# Patient Record
Sex: Female | Born: 1945 | Race: White | Hispanic: No | Marital: Married | Smoking: Never smoker
Health system: Southern US, Community
[De-identification: ages and names within clinical notes are randomized; demographics above are authoritative.]

## PROBLEM LIST (undated history)

## (undated) DIAGNOSIS — R112 Nausea with vomiting, unspecified: Secondary | ICD-10-CM

## (undated) DIAGNOSIS — E7800 Pure hypercholesterolemia, unspecified: Secondary | ICD-10-CM

## (undated) DIAGNOSIS — M1990 Unspecified osteoarthritis, unspecified site: Secondary | ICD-10-CM

## (undated) DIAGNOSIS — Z9889 Other specified postprocedural states: Secondary | ICD-10-CM

## (undated) DIAGNOSIS — F419 Anxiety disorder, unspecified: Secondary | ICD-10-CM

## (undated) DIAGNOSIS — C801 Malignant (primary) neoplasm, unspecified: Secondary | ICD-10-CM

## (undated) HISTORY — PX: TUBAL LIGATION: SHX77

## (undated) HISTORY — PX: CHOLECYSTECTOMY: SHX55

## (undated) HISTORY — PX: KNEE SURGERY: SHX244

## (undated) HISTORY — PX: REFRACTIVE SURGERY: SHX103

## (undated) HISTORY — PX: TONSILLECTOMY: SUR1361

## (undated) HISTORY — PX: APPENDECTOMY: SHX54

## (undated) HISTORY — PX: CATARACT EXTRACTION W/ INTRAOCULAR LENS  IMPLANT, BILATERAL: SHX1307

## (undated) HISTORY — PX: BACK SURGERY: SHX140

## (undated) HISTORY — PX: SHOULDER ARTHROSCOPY: SHX128

## (undated) HISTORY — PX: CARPAL TUNNEL RELEASE: SHX101

## (undated) HISTORY — PX: ABDOMINAL HYSTERECTOMY: SHX81

---

## 2020-05-16 ENCOUNTER — Other Ambulatory Visit: Payer: Self-pay | Admitting: Neurological Surgery

## 2020-05-23 NOTE — Progress Notes (Signed)
[id number] [address], [place] [address] Phone: [phone] Fax: [phone]      Your procedure is scheduled on September 21  Report to [facility] Main Entrance "A" at 0530 A.M., and check in at the Admitting office.  Call this number if you have problems the morning of surgery:  [phone]  Call [phone] if you have any questions prior to your surgery date Monday-Friday 8am-4pm    Remember:  Do not eat or drink after midnight the night before your surgery     Take these medicines the morning of surgery with A SIP OF WATER  acetaminophen (TYLENOL) if needed escitalopram (LEXAPRO) gabapentin (NEURONTIN)  As of today, STOP taking any Aspirin (unless otherwise instructed by your surgeon) Aleve, Naproxen, Ibuprofen, Motrin, Advil, Goody's, BC's, all herbal medications, fish oil, and all vitamins.                      Do not wear jewelry, make up, or nail polish            Do not wear lotions, powders, perfumes/colognes, or deodorant.            Do not shave 48 hours prior to surgery.              Do not bring valuables to the hospital.            [facility] is not responsible for any belongings or valuables.  Do NOT Smoke (Tobacco/Vaping) or drink Alcohol 24 hours prior to your procedure If you use a CPAP at night, you may bring all equipment for your overnight stay.   Contacts, glasses, dentures or bridgework may not be worn into surgery.      For patients admitted to the hospital, discharge time will be determined by your treatment team.   Patients discharged the day of surgery will not be allowed to drive home, and someone needs to stay with them for 24 hours.    Special instructions:   [place]- Preparing For Surgery  Before surgery, you can play an important role. Because skin is not sterile, your skin needs to be as free of germs as possible. You can reduce the number of germs on your skin by washing with  CHG (chlorahexidine gluconate) Soap before surgery.  CHG is an antiseptic cleaner which kills germs and bonds with the skin to continue killing germs even after washing.    Oral Hygiene is also important to reduce your risk of infection.  Remember - BRUSH YOUR TEETH THE MORNING OF SURGERY WITH YOUR REGULAR TOOTHPASTE  Please do not use if you have an allergy to CHG or antibacterial soaps. If your skin becomes reddened/irritated stop using the CHG.  Do not shave (including legs and underarms) for at least 48 hours prior to first CHG shower. It is OK to shave your face.  Please follow these instructions carefully.   1. Shower the NIGHT BEFORE SURGERY and the MORNING OF SURGERY with CHG Soap.   2. If you chose to wash your hair, wash your hair first as usual with your normal shampoo.  3. After you shampoo, rinse your hair and body thoroughly to remove the shampoo.  4. Use CHG as you would any other liquid soap. You can apply CHG directly to the skin and wash gently with a scrungie or a clean washcloth.   5. Apply the CHG Soap to your body ONLY  FROM THE NECK DOWN.  Do not use on open wounds or open sores. Avoid contact with your eyes, ears, mouth and genitals (private parts). Wash Face and genitals (private parts)  with your normal soap.   6. Wash thoroughly, paying special attention to the area where your surgery will be performed.  7. Thoroughly rinse your body with warm water from the neck down.  8. DO NOT shower/wash with your normal soap after using and rinsing off the CHG Soap.  9. Pat yourself dry with a CLEAN TOWEL.  10. Wear CLEAN PAJAMAS to bed the night before surgery  11. Place CLEAN SHEETS on your bed the night of your first shower and DO NOT SLEEP WITH PETS.   Day of Surgery: Wear Clean/Comfortable clothing the morning of surgery Do not apply any deodorants/lotions.   Remember to brush your teeth WITH YOUR REGULAR TOOTHPASTE.   Please read over the following fact sheets  that you were given.

## 2020-05-24 ENCOUNTER — Encounter (HOSPITAL_COMMUNITY)
Admission: RE | Admit: 2020-05-24 | Discharge: 2020-05-24 | Disposition: A | Discharge status: Home / Self Care | Payer: Medicare PPO | Source: Ambulatory Visit | Attending: Neurological Surgery | Admitting: Neurological Surgery

## 2020-05-24 ENCOUNTER — Other Ambulatory Visit: Payer: Self-pay

## 2020-05-24 ENCOUNTER — Other Ambulatory Visit (HOSPITAL_COMMUNITY)
Admission: RE | Admit: 2020-05-24 | Discharge: 2020-05-24 | Disposition: A | Discharge status: Home / Self Care | Payer: Medicare PPO | Source: Ambulatory Visit | Attending: Neurological Surgery | Admitting: Neurological Surgery

## 2020-05-24 ENCOUNTER — Encounter (HOSPITAL_COMMUNITY): Payer: Self-pay

## 2020-05-24 DIAGNOSIS — M1990 Unspecified osteoarthritis, unspecified site: Secondary | ICD-10-CM | POA: Diagnosis not present

## 2020-05-24 DIAGNOSIS — E669 Obesity, unspecified: Secondary | ICD-10-CM | POA: Diagnosis not present

## 2020-05-24 DIAGNOSIS — Z79899 Other long term (current) drug therapy: Secondary | ICD-10-CM | POA: Insufficient documentation

## 2020-05-24 DIAGNOSIS — F419 Anxiety disorder, unspecified: Secondary | ICD-10-CM | POA: Insufficient documentation

## 2020-05-24 DIAGNOSIS — Z01812 Encounter for preprocedural laboratory examination: Secondary | ICD-10-CM | POA: Insufficient documentation

## 2020-05-24 DIAGNOSIS — E7800 Pure hypercholesterolemia, unspecified: Secondary | ICD-10-CM | POA: Diagnosis not present

## 2020-05-24 DIAGNOSIS — Z20822 Contact with and (suspected) exposure to covid-19: Secondary | ICD-10-CM | POA: Insufficient documentation

## 2020-05-24 DIAGNOSIS — Z6834 Body mass index (BMI) 34.0-34.9, adult: Secondary | ICD-10-CM | POA: Diagnosis not present

## 2020-05-24 DIAGNOSIS — M5412 Radiculopathy, cervical region: Secondary | ICD-10-CM | POA: Diagnosis not present

## 2020-05-24 HISTORY — DX: Anxiety disorder, unspecified: F41.9

## 2020-05-24 HISTORY — DX: Other specified postprocedural states: Z98.890

## 2020-05-24 HISTORY — DX: Pure hypercholesterolemia, unspecified: E78.00

## 2020-05-24 HISTORY — DX: Malignant (primary) neoplasm, unspecified: C80.1

## 2020-05-24 HISTORY — DX: Other specified postprocedural states: R11.2

## 2020-05-24 HISTORY — DX: Unspecified osteoarthritis, unspecified site: M19.90

## 2020-05-24 LAB — TYPE AND SCREEN
ABO/RH(D): O POS
Antibody Screen: NEGATIVE

## 2020-05-24 LAB — CBC
HCT: 45.3 % (ref 36.0–46.0)
Hemoglobin: 14.3 g/dL (ref 12.0–15.0)
MCH: 30.4 pg (ref 26.0–34.0)
MCHC: 31.6 g/dL (ref 30.0–36.0)
MCV: 96.4 fL (ref 80.0–100.0)
Platelets: 212 10*3/uL (ref 150–400)
RBC: 4.7 MIL/uL (ref 3.87–5.11)
RDW: 12 % (ref 11.5–15.5)
WBC: 5.4 10*3/uL (ref 4.0–10.5)
nRBC: 0 % (ref 0.0–0.2)

## 2020-05-24 LAB — SURGICAL PCR SCREEN
MRSA, PCR: NEGATIVE
Staphylococcus aureus: NEGATIVE

## 2020-05-24 LAB — SARS CORONAVIRUS 2 (TAT 6-24 HRS): SARS Coronavirus 2: NEGATIVE

## 2020-05-24 NOTE — Progress Notes (Signed)
PCP - Dr. Tawny Asal Cardiologist - patient denies  PPM/ICD - n/a Device Orders -  Rep Notified -   Chest x-ray - n/a EKG - n/a Stress Test - patient denies ECHO - patient stated it was 20+ years ago and she has never had to follow up regarding it. Cardiac Cath - patient denies  Sleep Study - patient denies CPAP -   Fasting Blood Sugar - n/a Checks Blood Sugar _____ times a day  Blood Thinner Instructions: n/a Aspirin Instructions:n/a  ERAS Protcol - n/a, order for NPO p midnight PRE-SURGERY Ensure or G2-   COVID TEST- after PAT appointment on 05/24/20   Anesthesia review: n/a  Patient denies shortness of breath, fever, cough and chest pain at PAT appointment   All instructions explained to the patient, with a verbal understanding of the material. Patient agrees to go over the instructions while at home for a better understanding. Patient also instructed to self quarantine after being tested for COVID-19. The opportunity to ask questions was provided.

## 2020-05-27 ENCOUNTER — Other Ambulatory Visit: Payer: Self-pay | Admitting: Neurological Surgery

## 2020-05-27 NOTE — Progress Notes (Signed)
Anesthesia Chart Review:  Case: [id number] Date/Time: 05/28/20 0715   Procedure: ANTERIOR CERVICAL DECOMPRESSION/DISCECTOMY FUSION, CERVICAL 4- CERVICAL 5, CERVICAL 5- CERVICAL 6 (N/A ) - 3C   Anesthesia type: General   Pre-op diagnosis: CERVICAL RADICULOPATHY   Location: [facility] OR ROOM 22 / [place] [facility]   Surgeons: Dawley, Theodoro Doing, DO      DISCUSSION: Patient is a 74 year old female scheduled for the above procedure.  History includes never smoker, postoperative N/V, anxiety, melanoma (back), hypercholesterolemia. BMI is consistent with obesity.   Surgical clearance signed by Dellia Nims, NP, "modeate risk". Last office note requested, but is pending.  Patient denied chest pain, shortness of breath, cough, fever PAT RN visit. She reportedly was recently diagnosed with hypercholesterolemia, but medication is not being started until following surgery.   05/24/20 presurgical COVID-19 test was negative. Anesthesia team to evaluate on the day of surgery.   VS: BP (!) 142/75   Pulse 73   Temp 36.5 C (Oral)   Resp 17   Ht 5\' 3"  (1.6 m)   Wt 87.4 kg   SpO2 97%   BMI 34.14 kg/m    PROVIDERS: Josem Kaufmann, MD is PCP ([facility] in [place])   LABS: Labs reviewed: Acceptable for surgery. (all labs ordered are listed, but only abnormal results are displayed)  Labs Reviewed  SURGICAL PCR SCREEN  CBC  TYPE AND SCREEN    EKG: N/A   CV: Patient reported a remote history of echocardiogram greater than 20 years ago that did not require ongoing follow-up.   Past Medical History:  Diagnosis Date  . Anxiety   . Arthritis    "in knees and neck"  . Cancer ([place])    "melanoma on back"  . High cholesterol   . PONV (postoperative nausea and vomiting)     Past Surgical History:  Procedure Laterality Date  . ABDOMINAL HYSTERECTOMY    . APPENDECTOMY    . BACK SURGERY     cervical, x2  . CARPAL TUNNEL RELEASE Right   . CATARACT EXTRACTION W/ INTRAOCULAR LENS  IMPLANT, BILATERAL    .  CHOLECYSTECTOMY    . KNEE SURGERY Right   . REFRACTIVE SURGERY    . SHOULDER ARTHROSCOPY Left   . TONSILLECTOMY    . TUBAL LIGATION      MEDICATIONS: . acetaminophen (TYLENOL) 500 MG tablet  . escitalopram (LEXAPRO) 10 MG tablet  . gabapentin (NEURONTIN) 100 MG capsule   No current facility-administered medications for this encounter.    Myra Gianotti, PA-C Surgical Short Stay/Anesthesiology [facility] Phone [phone] [facility] Phone [phone] 05/27/2020 3:09 PM

## 2020-05-27 NOTE — Anesthesia Preprocedure Evaluation (Addendum)
Anesthesia Evaluation  Patient identified by MRN, date of birth, ID band Patient awake    Reviewed: Allergy & Precautions, H&P , NPO status , Patient's Chart, lab work & pertinent test results  History of Anesthesia Complications (+) PONV and history of anesthetic complications  Airway Mallampati: II   Neck ROM: full    Dental   Pulmonary    breath sounds clear to auscultation       Cardiovascular negative cardio ROS   Rhythm:regular Rate:Normal     Neuro/Psych PSYCHIATRIC DISORDERS Anxiety radiculopathy  Neuromuscular disease    GI/Hepatic   Endo/Other    Renal/GU      Musculoskeletal  (+) Arthritis ,   Abdominal (+) + obese,   Peds  Hematology   Anesthesia Other Findings CERVICAL RADICULOPATHY  Reproductive/Obstetrics                            Anesthesia Physical Anesthesia Plan  ASA: II  Anesthesia Plan: General   Post-op Pain Management:    Induction: Intravenous  PONV Risk Score and Plan: 4 or greater and Ondansetron, Dexamethasone, Midazolam, Treatment may vary due to age or medical condition and Propofol infusion  Airway Management Planned: Oral ETT and Video Laryngoscope Planned  Additional Equipment:   Intra-op Plan:   Post-operative Plan: Extubation in OR  Informed Consent: I have reviewed the patients History and Physical, chart, labs and discussed the procedure including the risks, benefits and alternatives for the proposed anesthesia with the patient or authorized representative who has indicated his/her understanding and acceptance.     Dental advisory given  Plan Discussed with: CRNA and Anesthesiologist  Anesthesia Plan Comments: (PAT note written 05/27/2020 by Myra Gianotti, PA-C. )      Anesthesia Quick Evaluation

## 2020-05-28 ENCOUNTER — Observation Stay (HOSPITAL_COMMUNITY)
Admission: RE | Admit: 2020-05-28 | Discharge: 2020-05-29 | Disposition: A | Discharge status: Home / Self Care | Payer: Medicare PPO | Attending: Neurological Surgery | Admitting: Neurological Surgery

## 2020-05-28 ENCOUNTER — Encounter (HOSPITAL_COMMUNITY)
Admission: RE | Disposition: A | Discharge status: Home / Self Care | Payer: Self-pay | Source: Home / Self Care | Attending: Neurological Surgery

## 2020-05-28 ENCOUNTER — Encounter (HOSPITAL_COMMUNITY): Payer: Self-pay | Admitting: Neurological Surgery

## 2020-05-28 ENCOUNTER — Other Ambulatory Visit: Payer: Self-pay

## 2020-05-28 ENCOUNTER — Ambulatory Visit (HOSPITAL_COMMUNITY): Payer: Medicare PPO

## 2020-05-28 ENCOUNTER — Ambulatory Visit (HOSPITAL_COMMUNITY): Payer: Medicare PPO | Admitting: Physician Assistant

## 2020-05-28 DIAGNOSIS — M4722 Other spondylosis with radiculopathy, cervical region: Secondary | ICD-10-CM | POA: Insufficient documentation

## 2020-05-28 DIAGNOSIS — M5412 Radiculopathy, cervical region: Secondary | ICD-10-CM | POA: Diagnosis present

## 2020-05-28 DIAGNOSIS — M542 Cervicalgia: Secondary | ICD-10-CM | POA: Diagnosis present

## 2020-05-28 DIAGNOSIS — M50122 Cervical disc disorder at C5-C6 level with radiculopathy: Principal | ICD-10-CM | POA: Insufficient documentation

## 2020-05-28 DIAGNOSIS — Z419 Encounter for procedure for purposes other than remedying health state, unspecified: Secondary | ICD-10-CM

## 2020-05-28 HISTORY — PX: ANTERIOR CERVICAL DECOMP/DISCECTOMY FUSION: SHX1161

## 2020-05-28 LAB — ABO/RH: ABO/RH(D): O POS

## 2020-05-28 SURGERY — ANTERIOR CERVICAL DECOMPRESSION/DISCECTOMY FUSION 2 LEVELS
Anesthesia: General | Site: Spine Cervical

## 2020-05-28 MED ORDER — CHLORHEXIDINE GLUCONATE CLOTH 2 % EX PADS: 6.0000 | MEDICATED_PAD | Freq: Once | CUTANEOUS | Status: DC

## 2020-05-28 MED ORDER — METHOCARBAMOL 500 MG PO TABS: 500.0000 mg | ORAL_TABLET | Freq: Four times a day (QID) | ORAL | Status: DC | PRN

## 2020-05-28 MED ORDER — ONDANSETRON HCL 4 MG/2ML IJ SOLN
INTRAMUSCULAR | Status: DC | PRN
  Administered 2020-05-28 (×2): 4 mg via INTRAVENOUS

## 2020-05-28 MED ORDER — SUCCINYLCHOLINE CHLORIDE 200 MG/10ML IV SOSY
PREFILLED_SYRINGE | INTRAVENOUS | Status: AC
  Filled 2020-05-28: qty 10

## 2020-05-28 MED ORDER — PHENYLEPHRINE HCL-NACL 10-0.9 MG/250ML-% IV SOLN
INTRAVENOUS | Status: DC | PRN
  Administered 2020-05-28: 30 ug/min via INTRAVENOUS

## 2020-05-28 MED ORDER — ONDANSETRON HCL 4 MG/2ML IJ SOLN
INTRAMUSCULAR | Status: AC
  Filled 2020-05-28: qty 2

## 2020-05-28 MED ORDER — LIDOCAINE-EPINEPHRINE 1 %-1:100000 IJ SOLN
INTRAMUSCULAR | Status: AC
  Filled 2020-05-28: qty 1

## 2020-05-28 MED ORDER — DEXAMETHASONE SODIUM PHOSPHATE 10 MG/ML IJ SOLN
INTRAMUSCULAR | Status: DC | PRN
  Administered 2020-05-28: 10 mg via INTRAVENOUS

## 2020-05-28 MED ORDER — ONDANSETRON HCL 4 MG PO TABS: 4.0000 mg | ORAL_TABLET | Freq: Four times a day (QID) | ORAL | Status: DC | PRN

## 2020-05-28 MED ORDER — HEMOSTATIC AGENTS (NO CHARGE) OPTIME
TOPICAL | Status: DC | PRN
  Administered 2020-05-28: 1 via TOPICAL

## 2020-05-28 MED ORDER — EPHEDRINE 5 MG/ML INJ
INTRAVENOUS | Status: AC
  Filled 2020-05-28: qty 10

## 2020-05-28 MED ORDER — ORAL CARE MOUTH RINSE: 15.0000 mL | Freq: Once | OROMUCOSAL | Status: AC

## 2020-05-28 MED ORDER — FENTANYL CITRATE (PF) 100 MCG/2ML IJ SOLN
INTRAMUSCULAR | Status: DC | PRN
Start: 2020-05-28 — End: 2020-05-28
  Administered 2020-05-28 (×2): 25 ug via INTRAVENOUS
  Administered 2020-05-28 (×2): 50 ug via INTRAVENOUS
  Administered 2020-05-28: 25 ug via INTRAVENOUS

## 2020-05-28 MED ORDER — LIDOCAINE 2% (20 MG/ML) 5 ML SYRINGE
INTRAMUSCULAR | Status: DC | PRN
  Administered 2020-05-28: 60 mg via INTRAVENOUS

## 2020-05-28 MED ORDER — ACETAMINOPHEN 650 MG RE SUPP: 650.0000 mg | RECTAL | Status: DC | PRN

## 2020-05-28 MED ORDER — ESCITALOPRAM OXALATE 10 MG PO TABS
10.0000 mg | ORAL_TABLET | Freq: Every day | ORAL | Status: DC
  Filled 2020-05-28: qty 1

## 2020-05-28 MED ORDER — GABAPENTIN 100 MG PO CAPS
100.0000 mg | ORAL_CAPSULE | Freq: Three times a day (TID) | ORAL | Status: DC
  Administered 2020-05-28 (×2): 100 mg via ORAL
  Filled 2020-05-28 (×2): qty 1

## 2020-05-28 MED ORDER — SCOPOLAMINE 1 MG/3DAYS TD PT72
MEDICATED_PATCH | TRANSDERMAL | Status: AC
  Filled 2020-05-28: qty 1

## 2020-05-28 MED ORDER — SODIUM CHLORIDE 0.9% FLUSH: 3.0000 mL | INTRAVENOUS | Status: DC | PRN

## 2020-05-28 MED ORDER — HYDROXYZINE HCL 50 MG/ML IM SOLN: 50.0000 mg | Freq: Four times a day (QID) | INTRAMUSCULAR | Status: DC | PRN

## 2020-05-28 MED ORDER — KETOROLAC TROMETHAMINE 15 MG/ML IJ SOLN
7.5000 mg | Freq: Four times a day (QID) | INTRAMUSCULAR | Status: AC
  Administered 2020-05-28 – 2020-05-29 (×4): 7.5 mg via INTRAVENOUS
  Filled 2020-05-28 (×4): qty 1

## 2020-05-28 MED ORDER — SUGAMMADEX SODIUM 200 MG/2ML IV SOLN
INTRAVENOUS | Status: DC | PRN
  Administered 2020-05-28: 200 mg via INTRAVENOUS

## 2020-05-28 MED ORDER — OXYCODONE HCL 5 MG PO TABS: 5.0000 mg | ORAL_TABLET | Freq: Once | ORAL | Status: DC | PRN

## 2020-05-28 MED ORDER — PROPOFOL 500 MG/50ML IV EMUL
INTRAVENOUS | Status: DC | PRN
  Administered 2020-05-28: 75 ug/kg/min via INTRAVENOUS

## 2020-05-28 MED ORDER — PROPOFOL 10 MG/ML IV BOLUS
INTRAVENOUS | Status: DC | PRN
  Administered 2020-05-28: 150 mg via INTRAVENOUS

## 2020-05-28 MED ORDER — CEFAZOLIN SODIUM-DEXTROSE 2-4 GM/100ML-% IV SOLN
2.0000 g | INTRAVENOUS | Status: AC
  Administered 2020-05-28: 2 g via INTRAVENOUS
  Filled 2020-05-28: qty 100

## 2020-05-28 MED ORDER — THROMBIN 5000 UNITS EX SOLR
OROMUCOSAL | Status: DC | PRN
  Administered 2020-05-28: 5 mL via TOPICAL

## 2020-05-28 MED ORDER — PROPOFOL 1000 MG/100ML IV EMUL
INTRAVENOUS | Status: AC
  Filled 2020-05-28: qty 100

## 2020-05-28 MED ORDER — SODIUM CHLORIDE 0.9% FLUSH: 3.0000 mL | Freq: Two times a day (BID) | INTRAVENOUS | Status: DC

## 2020-05-28 MED ORDER — THROMBIN 5000 UNITS EX SOLR
CUTANEOUS | Status: AC
  Filled 2020-05-28: qty 10000

## 2020-05-28 MED ORDER — LIDOCAINE 2% (20 MG/ML) 5 ML SYRINGE
INTRAMUSCULAR | Status: AC
  Filled 2020-05-28: qty 5

## 2020-05-28 MED ORDER — PHENOL 1.4 % MT LIQD: 1.0000 | OROMUCOSAL | Status: DC | PRN

## 2020-05-28 MED ORDER — AMISULPRIDE (ANTIEMETIC) 5 MG/2ML IV SOLN
INTRAVENOUS | Status: AC
  Filled 2020-05-28: qty 4

## 2020-05-28 MED ORDER — THROMBIN 5000 UNITS EX SOLR
CUTANEOUS | Status: DC | PRN
  Administered 2020-05-28 (×2): 5000 [IU] via TOPICAL

## 2020-05-28 MED ORDER — DEXAMETHASONE SODIUM PHOSPHATE 10 MG/ML IJ SOLN
INTRAMUSCULAR | Status: AC
  Filled 2020-05-28: qty 1

## 2020-05-28 MED ORDER — FENTANYL CITRATE (PF) 100 MCG/2ML IJ SOLN
25.0000 ug | INTRAMUSCULAR | Status: DC | PRN
  Administered 2020-05-28 (×2): 25 ug via INTRAVENOUS

## 2020-05-28 MED ORDER — OXYCODONE HCL 5 MG/5ML PO SOLN: 5.0000 mg | Freq: Once | ORAL | Status: DC | PRN

## 2020-05-28 MED ORDER — ROCURONIUM BROMIDE 10 MG/ML (PF) SYRINGE
PREFILLED_SYRINGE | INTRAVENOUS | Status: AC
  Filled 2020-05-28: qty 10

## 2020-05-28 MED ORDER — CHLORHEXIDINE GLUCONATE 0.12 % MT SOLN
15.0000 mL | Freq: Once | OROMUCOSAL | Status: AC
  Administered 2020-05-28: 15 mL via OROMUCOSAL
  Filled 2020-05-28: qty 15

## 2020-05-28 MED ORDER — SENNOSIDES-DOCUSATE SODIUM 8.6-50 MG PO TABS: 1.0000 | ORAL_TABLET | Freq: Every evening | ORAL | Status: DC | PRN

## 2020-05-28 MED ORDER — PROPOFOL 10 MG/ML IV BOLUS
INTRAVENOUS | Status: AC
  Filled 2020-05-28: qty 40

## 2020-05-28 MED ORDER — MENTHOL 3 MG MT LOZG: 1.0000 | LOZENGE | OROMUCOSAL | Status: DC | PRN

## 2020-05-28 MED ORDER — ACETAMINOPHEN 10 MG/ML IV SOLN
INTRAVENOUS | Status: AC
  Filled 2020-05-28: qty 100

## 2020-05-28 MED ORDER — THROMBIN 5000 UNITS EX SOLR
CUTANEOUS | Status: AC
  Filled 2020-05-28: qty 5000

## 2020-05-28 MED ORDER — SCOPOLAMINE 1 MG/3DAYS TD PT72
1.0000 | MEDICATED_PATCH | TRANSDERMAL | Status: DC
  Administered 2020-05-28: 1.5 mg via TRANSDERMAL

## 2020-05-28 MED ORDER — ONDANSETRON HCL 4 MG/2ML IJ SOLN: 4.0000 mg | Freq: Four times a day (QID) | INTRAMUSCULAR | Status: DC | PRN

## 2020-05-28 MED ORDER — HYDROCODONE-ACETAMINOPHEN 5-325 MG PO TABS
1.0000 | ORAL_TABLET | ORAL | Status: DC | PRN
  Administered 2020-05-28: 1 via ORAL
  Filled 2020-05-28: qty 1

## 2020-05-28 MED ORDER — LACTATED RINGERS IV SOLN: INTRAVENOUS | Status: DC | PRN

## 2020-05-28 MED ORDER — METHOCARBAMOL 1000 MG/10ML IJ SOLN
500.0000 mg | Freq: Four times a day (QID) | INTRAVENOUS | Status: DC | PRN
  Filled 2020-05-28: qty 5

## 2020-05-28 MED ORDER — PHENYLEPHRINE HCL (PRESSORS) 10 MG/ML IV SOLN
INTRAVENOUS | Status: DC | PRN
  Administered 2020-05-28: 40 ug via INTRAVENOUS

## 2020-05-28 MED ORDER — CEFAZOLIN SODIUM-DEXTROSE 2-4 GM/100ML-% IV SOLN
2.0000 g | Freq: Three times a day (TID) | INTRAVENOUS | Status: AC
  Administered 2020-05-28 (×2): 2 g via INTRAVENOUS
  Filled 2020-05-28 (×2): qty 100

## 2020-05-28 MED ORDER — ALUM & MAG HYDROXIDE-SIMETH 200-200-20 MG/5ML PO SUSP: 30.0000 mL | Freq: Four times a day (QID) | ORAL | Status: DC | PRN

## 2020-05-28 MED ORDER — ROCURONIUM BROMIDE 100 MG/10ML IV SOLN
INTRAVENOUS | Status: DC | PRN
  Administered 2020-05-28: 50 mg via INTRAVENOUS
  Administered 2020-05-28 (×2): 20 mg via INTRAVENOUS
  Administered 2020-05-28: 10 mg via INTRAVENOUS

## 2020-05-28 MED ORDER — LACTATED RINGERS IV SOLN: INTRAVENOUS | Status: DC

## 2020-05-28 MED ORDER — TRIAMCINOLONE ACETONIDE 40 MG/ML IJ SUSP
INTRAMUSCULAR | Status: DC | PRN
  Administered 2020-05-28: 1 mL via INTRAMUSCULAR

## 2020-05-28 MED ORDER — AMISULPRIDE (ANTIEMETIC) 5 MG/2ML IV SOLN
10.0000 mg | Freq: Once | INTRAVENOUS | Status: AC
  Administered 2020-05-28: 10 mg via INTRAVENOUS

## 2020-05-28 MED ORDER — FENTANYL CITRATE (PF) 250 MCG/5ML IJ SOLN
INTRAMUSCULAR | Status: AC
  Filled 2020-05-28: qty 5

## 2020-05-28 MED ORDER — MIDAZOLAM HCL 2 MG/2ML IJ SOLN
INTRAMUSCULAR | Status: AC
  Filled 2020-05-28: qty 2

## 2020-05-28 MED ORDER — ACETAMINOPHEN 325 MG PO TABS: 650.0000 mg | ORAL_TABLET | ORAL | Status: DC | PRN

## 2020-05-28 MED ORDER — FENTANYL CITRATE (PF) 100 MCG/2ML IJ SOLN
INTRAMUSCULAR | Status: AC
  Filled 2020-05-28: qty 2

## 2020-05-28 MED ORDER — SODIUM CHLORIDE 0.9 % IV SOLN: 250.0000 mL | INTRAVENOUS | Status: DC

## 2020-05-28 MED ORDER — 0.9 % SODIUM CHLORIDE (POUR BTL) OPTIME
TOPICAL | Status: DC | PRN
  Administered 2020-05-28: 1000 mL

## 2020-05-28 MED ORDER — ACETAMINOPHEN 10 MG/ML IV SOLN
INTRAVENOUS | Status: DC | PRN
  Administered 2020-05-28: 1000 mg via INTRAVENOUS

## 2020-05-28 MED ORDER — PHENYLEPHRINE 40 MCG/ML (10ML) SYRINGE FOR IV PUSH (FOR BLOOD PRESSURE SUPPORT)
PREFILLED_SYRINGE | INTRAVENOUS | Status: AC
  Filled 2020-05-28: qty 10

## 2020-05-28 MED ORDER — SODIUM CHLORIDE 0.9 % IV SOLN: INTRAVENOUS | Status: DC

## 2020-05-28 MED ORDER — MIDAZOLAM HCL 5 MG/5ML IJ SOLN
INTRAMUSCULAR | Status: DC | PRN
  Administered 2020-05-28: 1 mg via INTRAVENOUS

## 2020-05-28 MED ORDER — TRIAMCINOLONE ACETONIDE 40 MG/ML IJ SUSP
INTRAMUSCULAR | Status: AC
  Filled 2020-05-28: qty 5

## 2020-05-28 SURGICAL SUPPLY — 74 items
BAG DECANTER FOR FLEXI CONT (MISCELLANEOUS) ×3 IMPLANT
BAND RUBBER #18 3X1/16 STRL (MISCELLANEOUS) ×6 IMPLANT
BENZOIN TINCTURE PRP APPL 2/3 (GAUZE/BANDAGES/DRESSINGS) ×3 IMPLANT
BIT DRILL NEURO 2X3.1 SFT TUCH (MISCELLANEOUS) ×1 IMPLANT
BLADE CLIPPER SURG (BLADE) IMPLANT
BLADE SURG 11 STRL SS (BLADE) ×3 IMPLANT
BUR MATCHSTICK NEURO 3.0 LAGG (BURR) ×3 IMPLANT
CANISTER SUCT 3000ML PPV (MISCELLANEOUS) ×3 IMPLANT
CLOSURE WOUND 1/2 X4 (GAUZE/BANDAGES/DRESSINGS) ×1
COVER WAND RF STERILE (DRAPES) IMPLANT
DECANTER SPIKE VIAL GLASS SM (MISCELLANEOUS) ×3 IMPLANT
DERMABOND ADVANCED (GAUZE/BANDAGES/DRESSINGS)
DERMABOND ADVANCED .7 DNX12 (GAUZE/BANDAGES/DRESSINGS) IMPLANT
DRAPE C-ARM 42X72 X-RAY (DRAPES) ×6 IMPLANT
DRAPE HALF SHEET 40X57 (DRAPES) IMPLANT
DRAPE LAPAROTOMY 100X72 PEDS (DRAPES) ×3 IMPLANT
DRAPE MICROSCOPE LEICA (MISCELLANEOUS) ×3 IMPLANT
DRILL NEURO 2X3.1 SOFT TOUCH (MISCELLANEOUS) ×3
DRSG OPSITE POSTOP 4X6 (GAUZE/BANDAGES/DRESSINGS) ×3 IMPLANT
DURAPREP 6ML APPLICATOR 50/CS (WOUND CARE) ×3 IMPLANT
ELECT COATED BLADE 2.86 ST (ELECTRODE) ×3 IMPLANT
ELECT REM PT RETURN 9FT ADLT (ELECTROSURGICAL) ×3
ELECTRODE REM PT RTRN 9FT ADLT (ELECTROSURGICAL) ×1 IMPLANT
EVACUATOR 1/8 PVC DRAIN (DRAIN) ×3 IMPLANT
FEE INTRAOP MONITOR IMPULS NCS (MISCELLANEOUS) ×1 IMPLANT
GAUZE 4X4 16PLY RFD (DISPOSABLE) IMPLANT
GLOVE BIO SURGEON STRL SZ 6.5 (GLOVE) ×2 IMPLANT
GLOVE BIO SURGEON STRL SZ7.5 (GLOVE) IMPLANT
GLOVE BIO SURGEON STRL SZ8 (GLOVE) ×3 IMPLANT
GLOVE BIO SURGEONS STRL SZ 6.5 (GLOVE) ×1
GLOVE BIOGEL PI IND STRL 6.5 (GLOVE) IMPLANT
GLOVE BIOGEL PI IND STRL 7.5 (GLOVE) ×1 IMPLANT
GLOVE BIOGEL PI IND STRL 8 (GLOVE) ×3 IMPLANT
GLOVE BIOGEL PI INDICATOR 6.5 (GLOVE)
GLOVE BIOGEL PI INDICATOR 7.5 (GLOVE) ×2
GLOVE BIOGEL PI INDICATOR 8 (GLOVE) ×6
GLOVE ECLIPSE 7.5 STRL STRAW (GLOVE) ×12 IMPLANT
GLOVE EXAM NITRILE LRG STRL (GLOVE) IMPLANT
GLOVE EXAM NITRILE XL STR (GLOVE) IMPLANT
GLOVE EXAM NITRILE XS STR PU (GLOVE) IMPLANT
GOWN STRL REUS W/ TWL LRG LVL3 (GOWN DISPOSABLE) ×1 IMPLANT
GOWN STRL REUS W/ TWL XL LVL3 (GOWN DISPOSABLE) ×2 IMPLANT
GOWN STRL REUS W/TWL 2XL LVL3 (GOWN DISPOSABLE) ×3 IMPLANT
GOWN STRL REUS W/TWL LRG LVL3 (GOWN DISPOSABLE) ×2
GOWN STRL REUS W/TWL XL LVL3 (GOWN DISPOSABLE) ×4
HEMOSTAT POWDER KIT SURGIFOAM (HEMOSTASIS) ×3 IMPLANT
INTRAOP MONITOR FEE IMPULS NCS (MISCELLANEOUS) ×1
INTRAOP MONITOR FEE IMPULSE (MISCELLANEOUS) ×2
KIT BASIN OR (CUSTOM PROCEDURE TRAY) ×3 IMPLANT
KIT TURNOVER KIT B (KITS) ×3 IMPLANT
NEEDLE BLUNT 18X1 FOR OR ONLY (NEEDLE) ×3 IMPLANT
NEEDLE HYPO 22GX1.5 SAFETY (NEEDLE) ×3 IMPLANT
NEEDLE SPNL 18GX3.5 QUINCKE PK (NEEDLE) ×3 IMPLANT
NS IRRIG 1000ML POUR BTL (IV SOLUTION) ×3 IMPLANT
PACK LAMINECTOMY NEURO (CUSTOM PROCEDURE TRAY) ×3 IMPLANT
PAD ARMBOARD 7.5X6 YLW CONV (MISCELLANEOUS) ×12 IMPLANT
PIN DISTRACTION 14MM (PIN) ×6 IMPLANT
PLATE CERV OZARK 2LVL 40 (Plate) ×3 IMPLANT
PUTTY DBX 1CC (Putty) ×3 IMPLANT
PUTTY DBX 1CC DEPUY (Putty) ×1 IMPLANT
SCREW FIXED ST OZARK 4X16 (Screw) ×6 IMPLANT
SCREW VA ST OZARK 4X16 (Plate) ×12 IMPLANT
SPACER ANGLD CASCAD 16X13X7 7D (Spacer) ×6 IMPLANT
SPONGE INTESTINAL PEANUT (DISPOSABLE) ×3 IMPLANT
SPONGE SURGIFOAM ABS GEL SZ50 (HEMOSTASIS) ×3 IMPLANT
STAPLER VISISTAT 35W (STAPLE) ×3 IMPLANT
STRIP CLOSURE SKIN 1/2X4 (GAUZE/BANDAGES/DRESSINGS) ×2 IMPLANT
SUT MNCRL AB 3-0 PS2 18 (SUTURE) IMPLANT
SUT VIC AB 3-0 SH 8-18 (SUTURE) IMPLANT
SYR 3ML LL SCALE MARK (SYRINGE) ×3 IMPLANT
TAPE CLOTH 3X10 TAN LF (GAUZE/BANDAGES/DRESSINGS) IMPLANT
TOWEL GREEN STERILE (TOWEL DISPOSABLE) ×3 IMPLANT
TOWEL GREEN STERILE FF (TOWEL DISPOSABLE) ×3 IMPLANT
WATER STERILE IRR 1000ML POUR (IV SOLUTION) ×3 IMPLANT

## 2020-05-28 NOTE — H&P (Signed)
Providing Compassionate, Quality Care - Together  NEUROSURGERY HISTORY & PHYSICAL   Chelsea Guerra is an 74 y.o. female.   Chief Complaint: Neck and arm pain HPI: This is a 73 y/o F with a history of an C6-7 ACDF in 1992.  She has chronic neck and arm pain for many years.  She had tried multiple epidural steroid injections this year which did not help her whatsoever.  Over the past year to year and a half her neck and arm pain has progressively worsened.  Her bilateral arm pain radiates from her neck down to her shoulders into her forearms bilaterally.  She also has challenges using her arms due to her severe arm pain.  She does not have any bowel or bladder changes.  Her MRI shows severe bilateral foraminal stenosis at C5-6 with degenerative disc disease and central stenosis.  There is also left greater than right foraminal stenosis at C4-5, the left is severe.  Past Medical History:  Diagnosis Date  . Anxiety   . Arthritis    "in knees and neck"  . Cancer ([place])    "melanoma on back"  . High cholesterol   . PONV (postoperative nausea and vomiting)     Past Surgical History:  Procedure Laterality Date  . ABDOMINAL HYSTERECTOMY    . APPENDECTOMY    . BACK SURGERY     cervical, x2  . CARPAL TUNNEL RELEASE Right   . CATARACT EXTRACTION W/ INTRAOCULAR LENS  IMPLANT, BILATERAL    . CHOLECYSTECTOMY    . KNEE SURGERY Right   . REFRACTIVE SURGERY    . SHOULDER ARTHROSCOPY Left   . TONSILLECTOMY    . TUBAL LIGATION      History reviewed. No pertinent family history. Social History:  reports that she has never smoked. She has never used smokeless tobacco. She reports previous alcohol use. She reports that she does not use drugs.  Allergies:  Allergies  Allergen Reactions  . Ciprofloxacin Nausea And Vomiting and Other (See Comments)    Sick--felt ill  . Sulfa Antibiotics Rash    Medications Prior to Admission  Medication Sig Dispense Refill  . acetaminophen (TYLENOL) 500  MG tablet Take 500-1,000 mg by mouth every 6 (six) hours as needed (for pain.).    Marland Kitchen escitalopram (LEXAPRO) 10 MG tablet Take 10 mg by mouth daily.    Marland Kitchen gabapentin (NEURONTIN) 100 MG capsule Take 100 mg by mouth in the morning, at noon, and at bedtime.      Results for orders placed or performed during the hospital encounter of 05/28/20 (from the past 48 hour(s))  ABO/Rh     Status: None (Preliminary result)   Collection Time: 05/28/20  6:45 AM  Result Value Ref Range   ABO/RH(D) PENDING    No results found.  ROS 14 point review of systems was obtained which all pertinent positives and negatives are listed in HPI above  Blood pressure (!) 145/71, pulse 74, temperature 98.2 F (36.8 C), resp. rate 17, height 5\' 3"  (1.6 m), weight 87.4 kg, SpO2 98 %. Physical Exam   AOx3 PERRLA Bilateral lower extremities 5/5 Bilateral upper extremities 4+/5, pain limited Sensory intact to light touch Cranial nerves II through XII intact  Assessment/Plan 74 year old female with  1.  C5, C6 bilateral severe radiculopathy due to cervical degenerative disc disease/foraminal stenosis 2.  Chronic neck pain  -OR today for C4-C6 ACDF -Postoperatively will continue with pain control -PT/OT -DVT prophylaxis -Has been medically cleared for  surgery -All risks and benefits of the surgery were discussed.  These included but are not limited to infection, hematoma, bleeding, stroke, heart attack, death, CSF leak, need for more surgery, hardware failure.   Thank you for allowing me to participate in this patient's care.  Please do not hesitate to call with questions or concerns.   Elwin Sleight, [facility] Cell: [phone]

## 2020-05-28 NOTE — Progress Notes (Signed)
   Providing Compassionate, Quality Care - Together  NEUROSURGERY PROGRESS NOTE   S: s/e in pacu, arm pain improved BL  O: EXAM:  BP (!) 115/56 (BP Location: Left Arm)   Pulse 87   Temp (!) 97.5 F (36.4 C)   Resp 16   Ht 5\' 3"  (1.6 m)   Wt 87.4 kg   SpO2 95%   BMI 34.13 kg/m   Awake, alert Speech fluent, appropriate  CNs grossly intact  Neck soft, trachea midline Collar in place Hemovac in place Normal phonation 5/5 BUE/BLE   ASSESSMENT:  74 y.o. female with  1.  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  Please do not hesitate to call with questions or concerns.   Elwin Sleight, [facility] Cell: [phone]

## 2020-05-28 NOTE — Progress Notes (Signed)
Orthopedic Tech Progress Note Patient Details:  Chelsea Guerra Mar 02, 1946 [id number] Patient already has collar per RN. Patient ID: Chelsea Guerra, female   DOB: Dec 10, 1945, 74 y.o.   MRN: [id number]   Petra Kuba 05/28/2020, 1:54 PM

## 2020-05-28 NOTE — Transfer of Care (Signed)
Immediate Anesthesia Transfer of Care Note  Patient: Chelsea Guerra  Procedure(s) Performed: ANTERIOR CERVICAL DECOMPRESSION/DISCECTOMY FUSION, CERVICAL FOUR CERVICAL FIVE, CERVICAL FIVE- CERVICAL SIX (N/A Spine Cervical)  Patient Location: PACU  Anesthesia Type:General  Level of Consciousness: drowsy  Airway & Oxygen Therapy: Patient Spontanous Breathing and Patient connected to face mask oxygen  Post-op Assessment: Report given to RN and Post -op Vital signs reviewed and stable  Post vital signs: Reviewed and stable  Last Vitals:  Vitals Value Taken Time  BP 127/67 05/28/20 1045  Temp 36.4 C 05/28/20 1045  Pulse 81 05/28/20 1046  Resp 21 05/28/20 1046  SpO2 100 % 05/28/20 1046  Vitals shown include unvalidated device data.  Last Pain:  Vitals:   05/28/20 1045  PainSc: (P) Asleep         Complications: No complications documented.

## 2020-05-28 NOTE — Anesthesia Procedure Notes (Signed)
Procedure Name: Intubation Date/Time: 05/28/2020 7:41 AM Performed by: Gwyndolyn Saxon, CRNA Pre-anesthesia Checklist: Patient identified, Emergency Drugs available, Suction available and Patient being monitored Patient Re-evaluated:Patient Re-evaluated prior to induction Oxygen Delivery Method: Circle system utilized Preoxygenation: Pre-oxygenation with 100% oxygen Induction Type: IV induction Ventilation: Mask ventilation without difficulty Laryngoscope Size: Glidescope and 3 Grade View: Grade I Tube type: Oral Tube size: 7.0 mm Number of attempts: 1 Airway Equipment and Method: Rigid stylet Placement Confirmation: ETT inserted through vocal cords under direct vision,  positive ETCO2 and breath sounds checked- equal and bilateral Secured at: 22 cm Tube secured with: Tape Dental Injury: Teeth and Oropharynx as per pre-operative assessment

## 2020-05-28 NOTE — Op Note (Signed)
DATE OF SERVICE: 05/28/2020  PREOP DIAGNOSIS: Cervical spondylosis with radiculopathy, C4-5, C5-6; Foraminal stenosis C4-5, C5-6 bilateral  POSTOP DIAGNOSIS: Same  PROCEDURE: 1. Arthrodesis C4-5, C5-6, anterior interbody technique 2. Placement of intervertebral biomechanical device C4-5, C5-6, K2M [place] cage 3. Placement of anterior instrumentation consisting of cervical plate and screws - C4, C5, C6; K45m [place] plate  4. Use of morselized bone allograft 5. Use of intraoperative microscope for microdissection and neural element decompression 6. Use of intraoperative neuro monitoring, SSEPs, greater than 1 hour  SURGEON: Dr. Elwin Sleight, DO  ASSISTANT: Dr. Lollie Marrow, MD  ANESTHESIA: General Endotracheal  EBL: 50cc  SPECIMENS: None  DRAINS: medium HMV  COMPLICATIONS: None immediate  CONDITION: Hemodynamically stable to PACU  HISTORY: Chelsea Guerra is a 74 y.o. y.o. female who initially presented to the outpatient clinic with complaints of chronic neck pain for years.  Over the past year and a half it had progressed and she also had bilateral C5 and C6 radiculopathy that had progressed over the past year.  She had attempted epidural steroid injections x3 which failed to improve her pain whatsoever. MRI demonstrated degenerative disc disease at C5-6 with severe foraminal stenosis bilaterally and moderate central stenosis.  There is degenerative disc disease that was moderate at C4-5 with severe left foraminal stenosis and moderate right foraminal stenosis. Treatment options were discussed including physical therapy, further injections and anterior cervical discectomy and fusion. She had already failed multiple injections and her pain was intractable and altering her lifestyle therefore surgery was offered.  After all questions were answered, informed consent was obtained.  PROCEDURE IN DETAIL: The patient was brought to the operating room at [facility] and transferred to the  operative table. After induction of general anesthesia, the patient was positioned on the operative table in the supine position with all pressure points meticulously padded.  Prepositioning SSEPs were obtained.  She was noted to have monitorable signals.  Her neck was placed in a slightly extended position.  Post positional SSEPs were stable.  The skin of the neck was then prepped and draped in the usual sterile fashion.  After timeout was conducted, skin incision was then made sharply with a 10 blade and Bovie electrocautery was used to dissect the subcutaneous tissue until the platysma was identified. The platysma was then divided and undermined. The sternocleidomastoid muscle was then identified and, utilizing natural fascial planes in the neck, the prevertebral fascia was identified and the carotid sheath was retracted laterally and the trachea and esophagus retracted medially.  A distraction pin was placed in the midline of the C5 vertebral body.  Fluoroscopy confirmed the C5 vertebral body pin placement.  Bovie electrocautery was used to dissect in the subperiosteal plane and elevate the bilateral longus coli muscles along C4, C5 and C6. Self-retaining retractors were then placed over the C5-6 disc base.  A distraction pin was placed in C6 vertebral body at midline.  At this point, the microscope was draped and brought into the field, and the remainder of the case was done under the microscope using microdissecting technique.  The disc space was incised sharply and rongeurs were use to initially complete a discectomy.  The distractor was placed and the disc base was placed under distraction.  The high-speed drill was then used to complete discectomy until the posterior annulus was identified and removed and the posterior longitudinal ligament was identified. Using a nerve hook, the PLL was elevated, and Kerrison rongeurs were used to remove the posterior longitudinal  ligament and the ventral thecal sac  was identified. Using a combination of curettes and Kerrison rongeurs, complete decompression of the thecal sac and exiting nerve roots at this level was completed, and verified using micro-nerve hook.  The nerve hook could pass freely bilaterally.  The anterior osteophytes at C5-C6 were removed with an osteophyte tool.  Using Surgi-Flo epidural hemostasis was obtained.  The disc base was taken out of distraction.  Interbody trials were used and a 7 x 13 x 16 mm 7 degree [place] interbody cage was selected.  The intervertebral device was filled with allograft.  The epidural space was irrigated and noted to be excellently hemostatic.  The interbody device was placed until flush with the anterior vertebral bodies.  The C6 distraction pin was removed and hemostasis was achieved with Surgi-Flo.  Distraction pin was then placed in the C4 vertebral body and midline.  The retractors were then repositioned along the C4-5 disc space.  The C4-5 disc base was incised sharply and placed under distraction.  Using pituitary rongeurs and curettes and initial discectomy was performed. The high-speed drill was then used to complete discectomy until the posterior annulus was identified and removed and the posterior longitudinal ligament was identified. Using a nerve hook, the PLL was elevated, and Kerrison rongeurs were used to remove the posterior longitudinal ligament and the ventral thecal sac was identified. Using a combination of curettes and Kerrison rongeurs, complete decompression of the thecal sac and exiting nerve roots at this level was completed, and verified using micro-nerve hook.  The disc base was taken out of distraction.  The nerve hook could pass freely bilaterally.  Epidural hemostasis was achieved with Surgi-Flo.  Trial spacers were used and a 7 mm x 13 x 16 7 degree lordotic [place] cage was selected.  The interbody device was filled with allograft.  The epidural space was irrigated and noted to be excellently  hemostatic.  The interbody device was then placed to be flush with the anterior vertebral bodies.  The distraction pins were removed and hemostasis was achieved with Surgi-Flo.  After placement of the intervertebral devices, the above anterior cervical plate was selected, and placed across the interspaces. Using a high-speed drill, the cortex of the cervical vertebral bodies was punctured, and screws inserted into C4, C5 and C6 bilaterally.  Final fluoroscopic images in AP and lateral projections were taken to confirm good hardware placement.  At this point, after all counts were verified to be correct, meticulous hemostasis was secured using a combination of bipolar electrocautery and passive hemostatics.  Kenalog was gently applied with a cottonoid to the lateral portion of the esophagus.  The wound was irrigated and noted to be hemostatic.  A medium Hemovac was tunneled laterally and placed in the prevertebral space.  The skin was then closed with staples.  Sterile dressing was applied.  Drapes were taken down.  The patient tolerated the procedure well and was extubated in the room and taken to the postanesthesia care unit in stable condition.

## 2020-05-29 ENCOUNTER — Encounter (HOSPITAL_COMMUNITY): Payer: Self-pay | Admitting: Neurological Surgery

## 2020-05-29 DIAGNOSIS — M50122 Cervical disc disorder at C5-C6 level with radiculopathy: Secondary | ICD-10-CM | POA: Diagnosis not present

## 2020-05-29 MED ORDER — METHOCARBAMOL 500 MG PO TABS: 500.0000 mg | ORAL_TABLET | Freq: Three times a day (TID) | ORAL | 1 refills | Status: AC | PRN

## 2020-05-29 MED ORDER — HYDROCODONE-ACETAMINOPHEN 5-325 MG PO TABS
1.0000 | ORAL_TABLET | ORAL | 0 refills | Status: AC | PRN
Start: 2020-05-29 — End: ?

## 2020-05-29 NOTE — Evaluation (Signed)
Occupational Therapy Evaluation Patient Details Name: Chelsea Guerra MRN: [id number] DOB: [birth year] Today's Date: 05/29/2020    History of Present Illness 74 yo female s/p ACDF c4-6  PMH anxiety melanoma(back) obese cervical surgery x2 shoulder arthroscopy L arthritis    Clinical Impression   Patient is s/p ACDF C4-6 surgery resulting in functional limitations due to the deficits listed below (see OT problem list). Pt with poor fitting ccollar so ortho tech contacted by RN to get a proper fit. Pt with no chin support when in brace correctly. Pt educated on cervical precautions with adls with return demo this session.  Patient will benefit from skilled OT acutely to increase independence and safety with ADLS to allow discharge home .     Follow Up Recommendations  No OT follow up    Equipment Recommendations  None recommended by OT    Recommendations for Other Services       Precautions / Restrictions Precautions Precautions: Fall;Cervical Precaution Booklet Issued: Yes (comment) Precaution Comments: reviewed adls with cervical precautions and handout present Required Braces or Orthoses: Cervical Brace Cervical Brace: Hard collar Restrictions Weight Bearing Restrictions: No      Mobility Bed Mobility Overal bed mobility: Needs Assistance Bed Mobility: Rolling;Sidelying to Sit;Sit to Supine Rolling: Modified independent (Device/Increase time) Sidelying to sit: Min assist   Sit to supine: Min assist Sit to sidelying: Min assist General bed mobility comments: educated on sequence and requires (A) to elevate trunk from bed surface. pt requires (A) To place legs back on bed surface  Transfers Overall transfer level: Needs assistance Equipment used: None Transfers: Sit to/from Stand Sit to Stand: Min guard              Balance Overall balance assessment: Mild deficits observed, not formally tested                                         ADL either  performed or assessed with clinical judgement   ADL Overall ADL's : Needs assistance/impaired Eating/Feeding: Set up;Sitting   Grooming: Oral care;Min guard;Standing Grooming Details (indicate cue type and reason): educated on use of cups                 Toilet Transfer: Min guard;BSC           Functional mobility during ADLs: Min guard General ADL Comments: pt with poor fitting ccollar at this time. OT calling RN to room to show that fit is very off with 2 inch gap in correct alignment. Ortho tech called to come replace the brace.    Cervical precautions ( handout provided): Educated patient on don doff brace with return demonstration, educated on oral care using cups, washing face with cloth, never to wash directly on incision site, avoid neck rotation flexion and extension, positioning with pillows in chair for bil UE, sleeping positioning, avoiding pushing / pulling with bil UE, Pt educated on need to notify doctor / RN of swallowing changes or choking..    Vision Patient Visual Report: No change from baseline       Perception     Praxis      Pertinent Vitals/Pain Pain Assessment: Faces Faces Pain Scale: Hurts a little bit Pain Location: surgical site Pain Descriptors / Indicators: Operative site guarding Pain Intervention(s): Monitored during session;Premedicated before session;Repositioned     Hand Dominance Right   Extremity/Trunk Assessment Upper Extremity Assessment  Upper Extremity Assessment: LUE deficits/detail RUE Deficits / Details: reports no deficits , able to reach top of head LUE Deficits / Details: reports baseline deficits of numbness at tips first and second digit   Lower Extremity Assessment Lower Extremity Assessment: Defer to PT evaluation RLE Deficits / Details: Grossly 5/5 LLE Deficits / Details: Grossly 5/5   Cervical / Trunk Assessment Cervical / Trunk Assessment: Other exceptions Cervical / Trunk Exceptions: s/p C4-6 ACDF    Communication Communication Communication: No difficulties   Cognition Arousal/Alertness: Awake/alert Behavior During Therapy: WFL for tasks assessed/performed Overall Cognitive Status: Within Functional Limits for tasks assessed                                     General Comments  educated on washing around incision , use of clean linens every shower. RN called to room to address new dressing at wound due to drainage present    Exercises     Shoulder Instructions      Home Living Family/patient expects to be discharged to:: Private residence Living Arrangements: Spouse/significant other;Children;Other relatives (daughter, granddaughter (59 y.o.)) Available Help at Discharge: Family Type of Home: House Home Access: Stairs to enter Technical brewer of Steps: 1 (small threshold)   Home Layout: One level     Bathroom Shower/Tub: Teacher, early years/pre: Standard     Home Equipment: Other (comment) (3 pronged cane)   Additional Comments: 3 dogs       Prior Functioning/Environment Level of Independence: Independent        Comments: Denies history of falls        OT Problem List: Decreased strength;Decreased activity tolerance;Impaired balance (sitting and/or standing);Pain;Impaired UE functional use      OT Treatment/Interventions: Self-care/ADL training;Therapeutic exercise;Neuromuscular education;Energy conservation;DME and/or AE instruction;Manual therapy;Therapeutic activities;Patient/family education;Balance training    OT Goals(Current goals can be found in the care plan section) Acute Rehab OT Goals Patient Stated Goal: to be able to use my hands more OT Goal Formulation: With patient/family Time For Goal Achievement: 06/12/20 Potential to Achieve Goals: Good  OT Frequency: Min 2X/week   Barriers to D/C:            Co-evaluation              AM-PAC OT "6 Clicks" Daily Activity     Outcome Measure Help from  another person eating meals?: None Help from another person taking care of personal grooming?: None Help from another person toileting, which includes using toliet, bedpan, or urinal?: A Little Help from another person bathing (including washing, rinsing, drying)?: A Little Help from another person to put on and taking off regular upper body clothing?: None Help from another person to put on and taking off regular lower body clothing?: A Little 6 Click Score: 21   End of Session Equipment Utilized During Treatment: Cervical collar Nurse Communication: Mobility status;Precautions  Activity Tolerance: Patient tolerated treatment well Patient left: in bed;with call bell/phone within reach;with family/visitor present  OT Visit Diagnosis: Unsteadiness on feet (R26.81);Pain                Time: 5366-4403 OT Time Calculation (min): 39 min Charges:  OT General Charges $OT Visit: 1 Visit OT Evaluation $OT Eval Moderate Complexity: 1 Mod OT Treatments $Self Care/Home Management : 8-22 mins   Brynn, OTR/L  Acute Rehabilitation Services Pager: [phone] Office: [phone] .   Jeri Modena  05/29/2020, 12:26 PM

## 2020-05-29 NOTE — Anesthesia Postprocedure Evaluation (Signed)
Anesthesia Post Note  Patient: Chelsea Guerra  Procedure(s) Performed: ANTERIOR CERVICAL DECOMPRESSION/DISCECTOMY FUSION, CERVICAL FOUR CERVICAL FIVE, CERVICAL FIVE- CERVICAL SIX (N/A Spine Cervical)     Patient location during evaluation: PACU Anesthesia Type: General Level of consciousness: awake and alert Pain management: pain level controlled Vital Signs Assessment: post-procedure vital signs reviewed and stable Respiratory status: spontaneous breathing, nonlabored ventilation, respiratory function stable and patient connected to nasal cannula oxygen Cardiovascular status: blood pressure returned to baseline and stable Postop Assessment: no apparent nausea or vomiting Anesthetic complications: no   No complications documented.  Last Vitals:  Vitals:   05/29/20 0839 05/29/20 1205  BP: 119/62 128/60  Pulse: 75 73  Resp: 20 20  Temp: 36.7 C 36.6 C  SpO2: 98% 94%    Last Pain:  Vitals:   05/29/20 1205  TempSrc: Oral  PainSc:                  [place]

## 2020-05-29 NOTE — Discharge Instructions (Signed)
Can shower in 5 days

## 2020-05-29 NOTE — Progress Notes (Signed)
   Providing Compassionate, Quality Care - Together  NEUROSURGERY PROGRESS NOTE   S: No issues overnight. Doing very well, significant improvement in her BL arm pain, tolerating diet and already worked with PT who rec outpt PT  O: EXAM:  BP 119/62 (BP Location: Left Arm)   Pulse 75   Temp 98 F (36.7 C) (Oral)   Resp 20   Ht 5\' 3"  (1.6 m)   Wt 87.4 kg   SpO2 98%   BMI 34.13 kg/m   Awake, alert, oriented  Speech fluent, appropriate  CNs grossly intact  Neck soft, trachea midline hmv in place Dressing c.d.i. 5/5 BUE/BLE  nml phonation  ASSESSMENT:  74 y.o. female with  1.  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  Please do not hesitate to call with questions or concerns.   Elwin Sleight, [facility] Cell: [phone]

## 2020-05-29 NOTE — Evaluation (Signed)
Physical Therapy Evaluation Patient Details Name: Chelsea Guerra MRN: [id number] DOB: 1946-02-21 Today's Date: 05/29/2020   History of Present Illness  74 yo female s/p ACDF c4-6  PMH anxiety melanoma(back) obese cervical surgery x2 shoulder arthroscopy L arthritis   Clinical Impression  Prior to admission, pt lives with her family and is independent. On PT evaluation, pt reports improvement in radicular symptoms/pain. Presents with bilateral upper extremity weakness, dynamic balance deficits and gait abnormalities. Ambulating 400 feet with no assistive device at a min guard assist level. Noted decreased bilateral foot clearance with fatigue. Recommended use of cane for negotiating unlevel surfaces and follow up OPPT for balance training and upper extremity strengthening.     Follow Up Recommendations Outpatient PT;Supervision for mobility/OOB    Equipment Recommendations  None recommended by PT (has cane)   Recommendations for Other Services       Precautions / Restrictions Precautions Precautions: Fall;Cervical Precaution Booklet Issued: Yes (comment) Precaution Comments: Verbally reviewed; provided written handout Required Braces or Orthoses: Cervical Brace Cervical Brace: Hard collar Restrictions Weight Bearing Restrictions: No      Mobility  Bed Mobility Overal bed mobility: Needs Assistance Bed Mobility: Rolling;Sidelying to Sit;Sit to Sidelying Rolling: Modified independent (Device/Increase time) Sidelying to sit: Min assist     Sit to sidelying: Min assist General bed mobility comments: HOB slightly elevated, minA for trunk to upright and assist for LE negotiation back into bed  Transfers Overall transfer level: Needs assistance Equipment used: None Transfers: Sit to/from Stand Sit to Stand: Min guard            Ambulation/Gait Ambulation/Gait assistance: Min guard Gait Distance (Feet): 400 Feet Assistive device: None Gait Pattern/deviations:  Step-through pattern;Decreased stride length Gait velocity: decreased   General Gait Details: Decreased bilateral foot clearance, particularly with fatigue. Cues for bringing arms to side (tendency to hold in high guard position), scapular retraction  Stairs            Wheelchair Mobility    Modified Rankin (Stroke Patients Only)       Balance Overall balance assessment: Mild deficits observed, not formally tested                                           Pertinent Vitals/Pain Pain Assessment: Faces Faces Pain Scale: Hurts a little bit Pain Location: surgical site Pain Descriptors / Indicators: Operative site guarding Pain Intervention(s): Monitored during session    Home Living Family/patient expects to be discharged to:: Private residence Living Arrangements: Spouse/significant other;Children;Other relatives (daughter, granddaughter (59 y.o.)) Available Help at Discharge: Family Type of Home: House Home Access: Stairs to enter   Technical brewer of Steps: 1 (small threshold) Home Layout: One level Home Equipment: Other (comment) (3 pronged cane)      Prior Function Level of Independence: Independent         Comments: Denies history of falls     Hand Dominance        Extremity/Trunk Assessment   Upper Extremity Assessment Upper Extremity Assessment: RUE deficits/detail;LUE deficits/detail RUE Deficits / Details: Grossly 3+/5 LUE Deficits / Details: Grossly 3+/5    Lower Extremity Assessment Lower Extremity Assessment: RLE deficits/detail;LLE deficits/detail RLE Deficits / Details: Grossly 5/5 LLE Deficits / Details: Grossly 5/5    Cervical / Trunk Assessment Cervical / Trunk Assessment: Other exceptions Cervical / Trunk Exceptions: s/p C4-6 ACDF  Communication   Communication:  No difficulties  Cognition Arousal/Alertness: Awake/alert Behavior During Therapy: WFL for tasks assessed/performed Overall Cognitive Status:  Within Functional Limits for tasks assessed                                        General Comments      Exercises     Assessment/Plan    PT Assessment Patient needs continued PT services  PT Problem List Decreased strength;Decreased activity tolerance;Decreased balance;Decreased mobility;Pain       PT Treatment Interventions DME instruction;Gait training;Functional mobility training;Therapeutic activities;Therapeutic exercise;Balance training;Patient/family education    PT Goals (Current goals can be found in the Care Plan section)  Acute Rehab PT Goals Patient Stated Goal: less arm pain/weakness PT Goal Formulation: With patient Time For Goal Achievement: 06/12/20 Potential to Achieve Goals: Good    Frequency Min 5X/week   Barriers to discharge        Co-evaluation               AM-PAC PT "6 Clicks" Mobility  Outcome Measure Help needed turning from your back to your side while in a flat bed without using bedrails?: None Help needed moving from lying on your back to sitting on the side of a flat bed without using bedrails?: A Little Help needed moving to and from a bed to a chair (including a wheelchair)?: A Little Help needed standing up from a chair using your arms (e.g., wheelchair or bedside chair)?: A Little Help needed to walk in hospital room?: A Little Help needed climbing 3-5 steps with a railing? : A Little 6 Click Score: 19    End of Session Equipment Utilized During Treatment: Cervical collar Activity Tolerance: Patient tolerated treatment well Patient left: in bed;with call bell/phone within reach Nurse Communication: Mobility status PT Visit Diagnosis: Pain;Unsteadiness on feet (R26.81) Pain - part of body:  (cervical)    Time: 1308-6578 PT Time Calculation (min) (ACUTE ONLY): 20 min   Charges:   PT Evaluation $PT Eval Low Complexity: [place], PT, DPT Acute Rehabilitation Services Pager  [phone] Office [phone]   Deno Etienne 05/29/2020, 9:40 AM

## 2020-05-29 NOTE — Plan of Care (Signed)
Pt doing well. Pt and daughter given D/C instructions with verbal understanding. Rx's were e-prescribed by MD. Pt's incision is clean and dry with no sign of infection. Pt's IV and Hemovac were removed prior to D/C. Pt D/C'd home via wheelchair per MD order. Pt is stable @ D/C and has no other needs at this time. Holli Humbles, RN

## 2020-05-29 NOTE — Discharge Summary (Signed)
   Physician Discharge Summary  Patient ID: Chelsea Guerra MRN: [id number] DOB/AGE: 1946/01/01 74 y.o.  Admit date: 05/28/2020 Discharge date: 05/29/2020  Admission Diagnoses:  1.  Bilateral C5, C6 radiculopathy with cervical spondylosis 2.  Cervicalgia  Discharge Diagnoses:  Same Active Problems:   Cervical radiculopathy at C5   Discharged Condition: Stable  Hospital Course:  Chelsea Guerra is a 74 y.o. female who presented to the outpatient clinic with complaints of bilateral arm pain in the C5-C6 dermatomes.  She also has chronic neck pain both of which were present for many years however over the past year it had worsened.  She failed conservative management.  She underwent the below surgery and tolerated it well.  She was observed overnight and her Hemovac drain was removed on postop day 1.  She was tolerating a normal diet, passed physical therapy and her pain was controlled on oral medications throughout her hospitalization.  She had improved neurologic function and was ambulating upon discharge.  She was having normal bowel and bladder function upon discharge.  Treatments: Surgery - ACDF C4-6 on 9/21/2021s  Discharge Exam: Blood pressure 119/62, pulse 75, temperature 98 F (36.7 C), temperature source Oral, resp. rate 20, height 5\' 3"  (1.6 m), weight 87.4 kg, SpO2 98 %. Awake, alert, oriented Speech fluent, appropriate CNs grossly intact 5/5 BUE/BLE Wound c/d/i Trachea midline nml phonation  Disposition: Discharge disposition: 01-Home or Self Care       Discharge Instructions    Incentive spirometry RT   Complete by: As directed      Allergies as of 05/29/2020      Reactions   Ciprofloxacin Nausea And Vomiting, Other (See Comments)   Sick--felt ill   Sulfa Antibiotics Rash      Medication List    TAKE these medications   acetaminophen 500 MG tablet Commonly known as: TYLENOL Take 500-1,000 mg by mouth every 6 (six) hours as needed (for pain.).    escitalopram 10 MG tablet Commonly known as: LEXAPRO Take 10 mg by mouth daily.   gabapentin 100 MG capsule Commonly known as: NEURONTIN Take 100 mg by mouth in the morning, at noon, and at bedtime.   HYDROcodone-acetaminophen 5-325 MG tablet Commonly known as: NORCO/VICODIN Take 1 tablet by mouth every 4 (four) hours as needed for moderate pain ((score 4 to 6)).   methocarbamol 500 MG tablet Commonly known as: ROBAXIN Take 1 tablet (500 mg total) by mouth every 8 (eight) hours as needed for muscle spasms.       Follow-up Information    Romona Murdy C, DO Follow up in 1 week(s).   Why: call for appointment on thursday afternoon 06/06/2020 Contact information: [address] [place] [address] [phone]               Signed: Theodoro Doing Amory Zbikowski 05/29/2020, 9:19 AM

## 2020-06-07 ENCOUNTER — Encounter (HOSPITAL_COMMUNITY): Payer: Self-pay | Admitting: Neurological Surgery

## 2021-05-15 ENCOUNTER — Other Ambulatory Visit: Payer: Self-pay | Admitting: Neurological Surgery

## 2021-05-15 DIAGNOSIS — G8929 Other chronic pain: Secondary | ICD-10-CM

## 2021-05-15 DIAGNOSIS — M545 Low back pain, unspecified: Secondary | ICD-10-CM

## 2021-05-16 ENCOUNTER — Other Ambulatory Visit: Payer: Self-pay | Admitting: Neurological Surgery

## 2021-05-16 DIAGNOSIS — M8580 Other specified disorders of bone density and structure, unspecified site: Secondary | ICD-10-CM

## 2021-06-02 ENCOUNTER — Ambulatory Visit
Admission: RE | Admit: 2021-06-02 | Discharge: 2021-06-02 | Disposition: A | Discharge status: Home / Self Care | Payer: Medicare PPO | Source: Ambulatory Visit | Attending: Neurological Surgery | Admitting: Neurological Surgery

## 2021-06-02 ENCOUNTER — Other Ambulatory Visit: Payer: Medicare PPO

## 2021-06-02 DIAGNOSIS — G8929 Other chronic pain: Secondary | ICD-10-CM

## 2021-06-02 DIAGNOSIS — M545 Low back pain, unspecified: Secondary | ICD-10-CM

## 2021-09-05 ENCOUNTER — Other Ambulatory Visit: Payer: Medicare PPO

## 2021-10-19 IMAGING — CT CT L SPINE W/O CM
5 series · 16 of 33 positions shown, 18 images · non-contrast
Comparison: Thoracolumbar MRI 09/04/2020

CLINICAL DATA: Chronic low back pain for 6 months

EXAM:
CT THORACIC AND LUMBAR SPINE WITHOUT CONTRAST
TECHNIQUE: Multidetector CT imaging of the thoracic and lumbar spine was
performed without contrast. Multiplanar CT image reconstructions
were also generated.

[Series 3: l-spine 2.00 br40 s3 (person_name) · axial · 0.38mm/px · z∈[+1554,+1622]mm · 2 of 103 slices shown]
[im 35/103  bone]
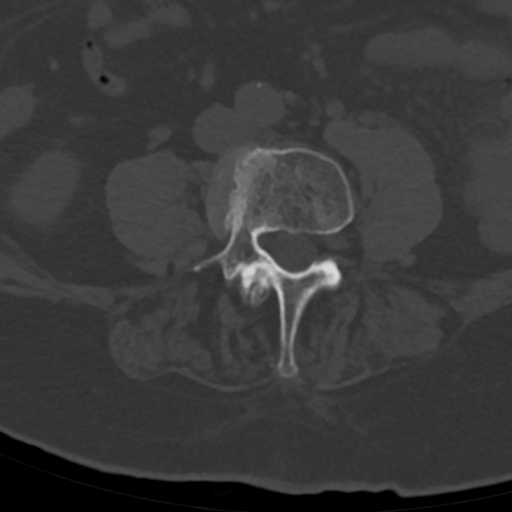
[im 69/103  bone]
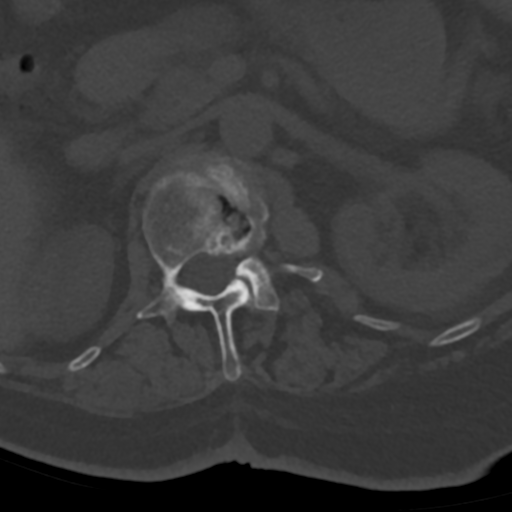

[Series 4: l-spine 2.00 br60 s3 lspine bone · axial · 0.38mm/px · z∈[+1536,+1638]mm · 3 of 103 slices shown, 4 images]
[im 26/103  soft-tissue]
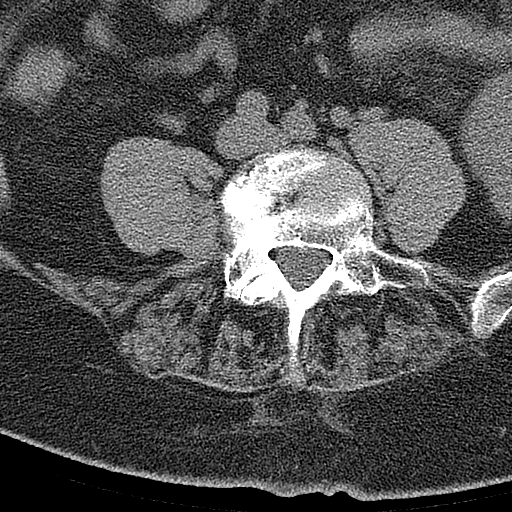
[im 26/103  bone]
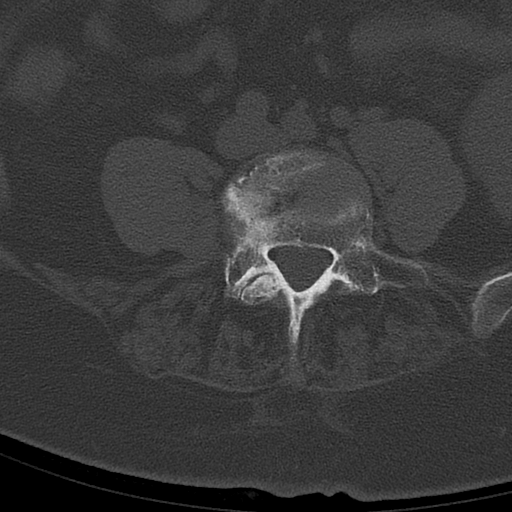
[im 52/103  bone]
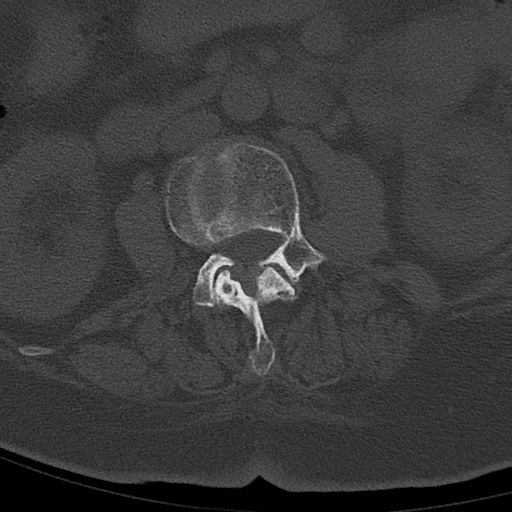
[im 77/103  bone]
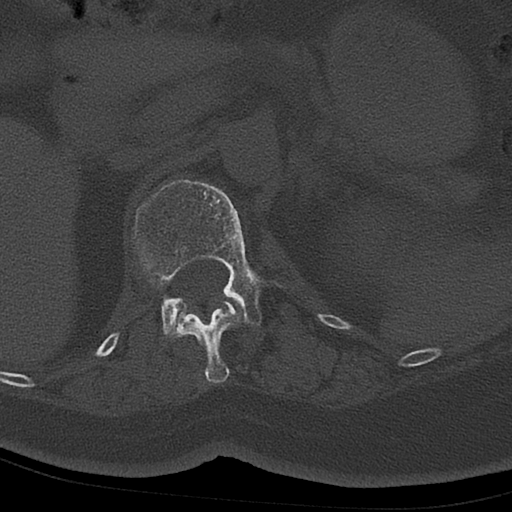

[Series 7: l-spine 2.00 br44 s3 sag soft · sagittal · 0.38mm/px · 5 of 98 slices shown, 6 images]
[im 33/98  bone]
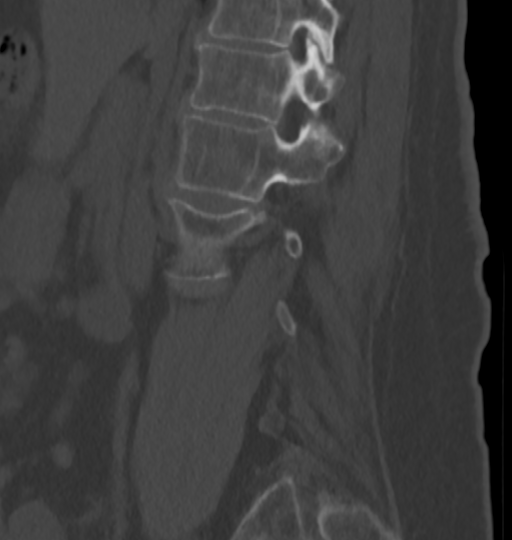
[im 41/98  bone]
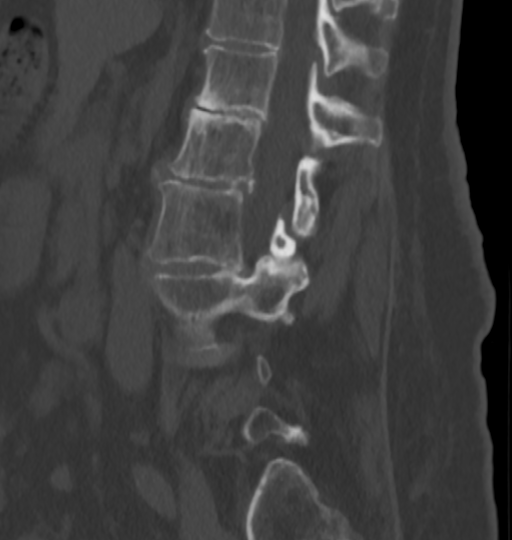
[im 49/98  soft-tissue]
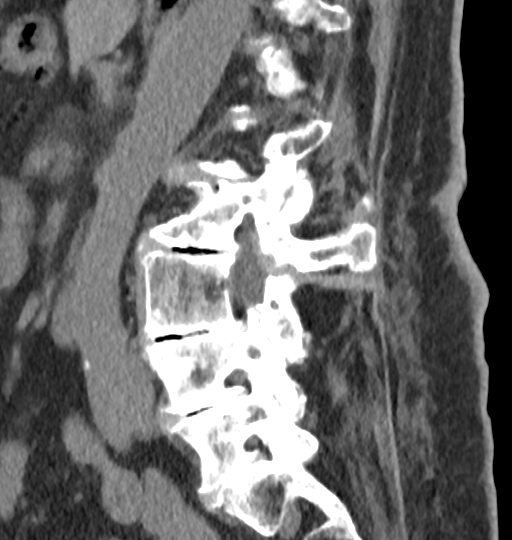
[im 49/98  bone]
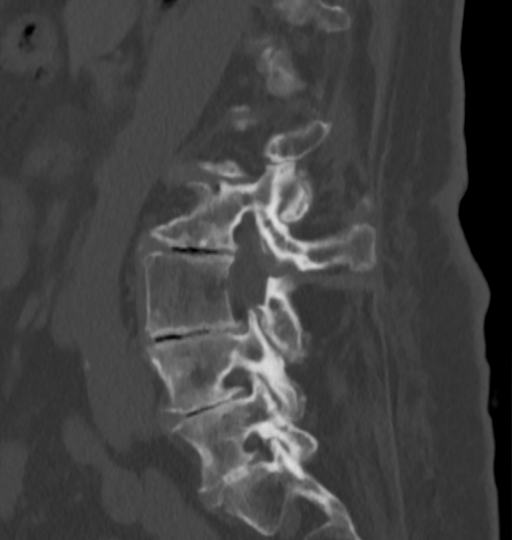
[im 57/98  bone]
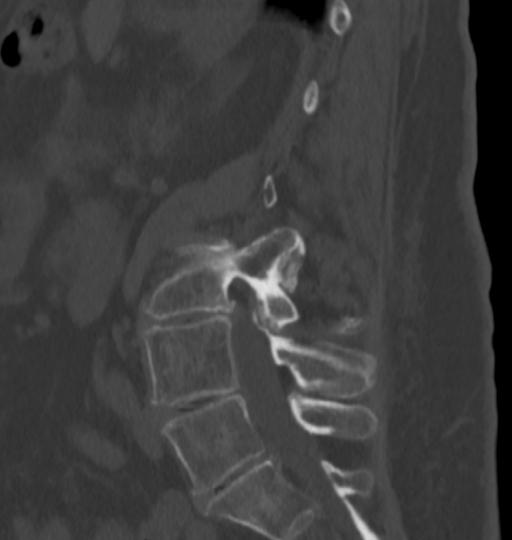
[im 65/98  bone]
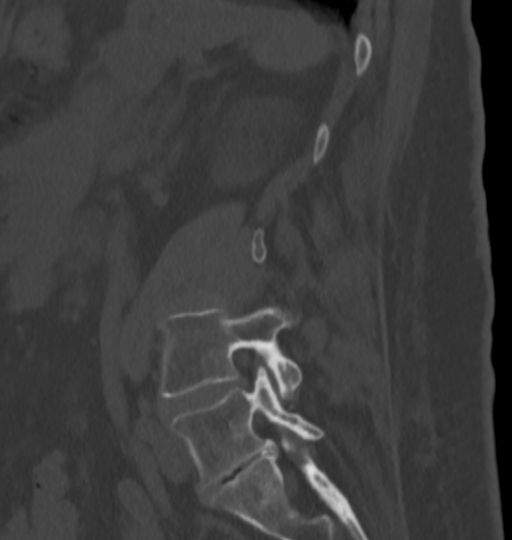

[Series 9: l-spine 2.00 br60 s3 cor bone · coronal · 0.38mm/px · 3 of 98 slices shown]
[im 20/98  bone]
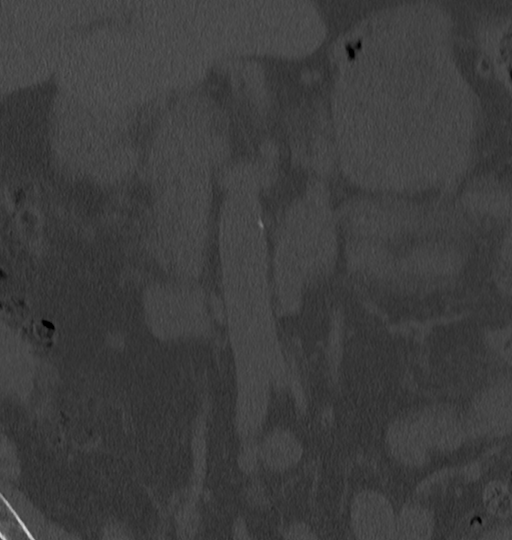
[im 39/98  bone]
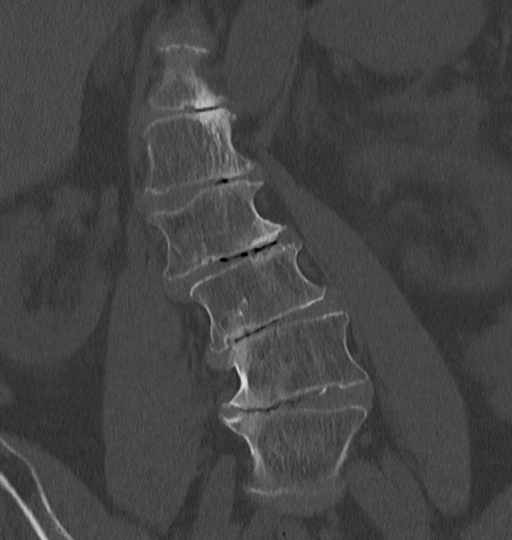
[im 59/98  bone]
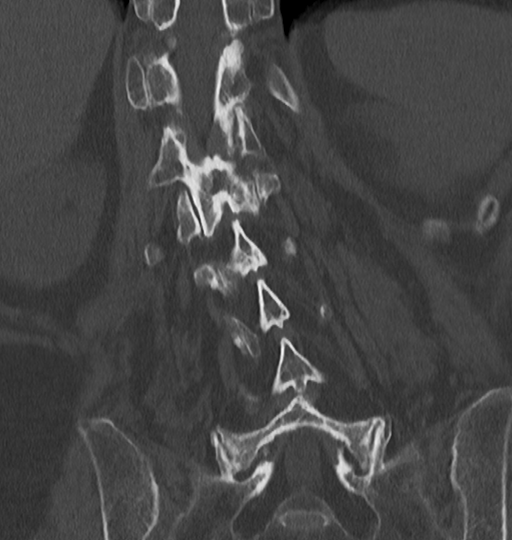

[Series 11: l-spine 2.00 br40 s3 true axials soft (person_name · axial · 0.31mm/px · z∈[+1522,+1642]mm · 3 of 105 slices shown]
[im 27/105  soft-tissue]
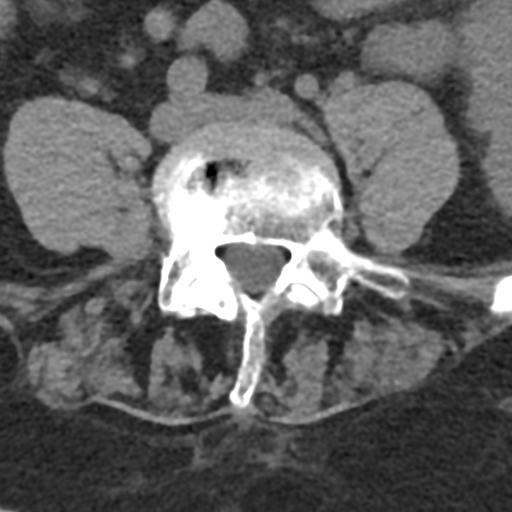
[im 53/105  soft-tissue]
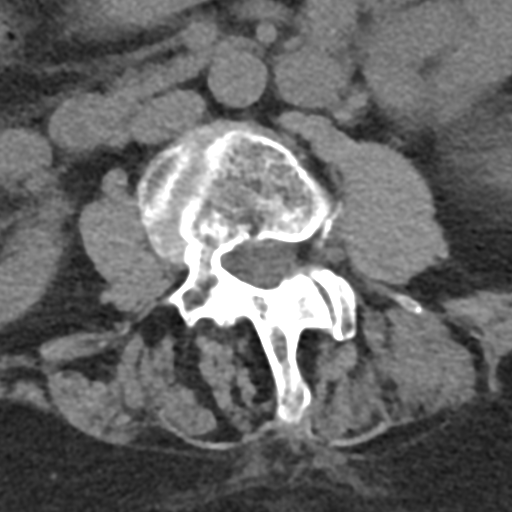
[im 79/105  soft-tissue]
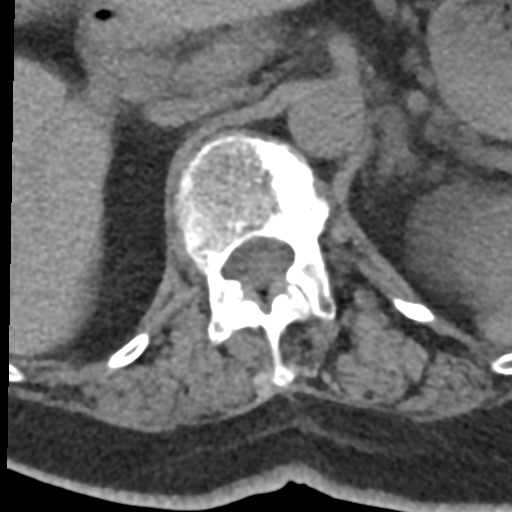

[16 of 33 positions shown; findings below may reference images not displayed]

FINDINGS: CT THORACIC SPINE FINDINGS

Alignment: Mild lower thoracic compensatory dextroscoliosis.

Vertebrae: Generalized osteopenic appearance. No evidence of
fracture, discitis, or aggressive bone lesion.

Paraspinal and other soft tissues: No evidence of perispinal mass or
inflammation. Partially covered goiter with superior thyroidal
extension.

Disc levels: ACDF and left-sided C6-7 laminotomy.

Facet osteoarthritis on the right at C7-T1.

T12-L1 degenerative disc collapse and endplate irregularity leftward
mild spurring. Mild left foraminal narrowing.

CT LUMBAR SPINE FINDINGS

Segmentation: Lowest open disc space is numbered L5-S1.

Alignment: Prominent dextroscoliosis with right lateral translation
the level of L3-4.

Vertebrae: No evidence of fracture, discitis, or aggressive bone
lesion.

Paraspinal and other soft tissues: Negative for perispinal mass or
inflammation

Disc levels:

T12- L1: Leftward disc collapse and endplate degeneration with facet
spurring. Mild left foraminal narrowing

L1-L2: Leftward disc collapse and endplate degeneration with facet
spurring. Mild to moderate left foraminal and subarticular recess
narrowing

L2-L3: Disc collapse and endplate degeneration with facet spurring
asymmetric to the left. Moderate narrowing the spinal canal

L3-L4: Disc collapse and endplate degeneration with facet spurring
asymmetric to the right. Moderate spinal canal narrowing and
bilateral subarticular recess narrowing. Mild-to-moderate right
foraminal narrowing

L4-L5: Disc collapse eccentric to the right with endplate
degeneration and ridging. Mild right foraminal narrowing

L5-S1:Disc collapse and endplate ridging effacing the inferior
foramina. Mild facet spurring.
IMPRESSION: CT THORACIC SPINE IMPRESSION

1. Mild compensatory lower thoracic scoliosis.
2. Notable disc degeneration at T12-L1 and facet osteoarthritis on
the right at C7-T1.

CT LUMBAR SPINE IMPRESSION

Dextroscoliosis with multilevel asymmetric disc collapse and facet
spurring causing mild-to-moderate foraminal and subarticular recess
narrowing. Moderate spinal stenosis at L2-3 and L3-4.

## 2021-10-19 IMAGING — CT CT T SPINE W/O CM
3 of 5 series · 9 of 33 positions shown, 10 images · non-contrast
Comparison: Thoracolumbar MRI 09/04/2020

CLINICAL DATA: Chronic low back pain for 6 months

EXAM:
CT THORACIC AND LUMBAR SPINE WITHOUT CONTRAST
TECHNIQUE: Multidetector CT imaging of the thoracic and lumbar spine was
performed without contrast. Multiplanar CT image reconstructions
were also generated.

[Series 6: t-spine 2.00 br60 s3 · axial · 0.30mm/px · z∈[+1789,+1789]mm · 1 of 159 slices shown, 2 images (1 of 3)]
[im 80/159  soft-tissue]
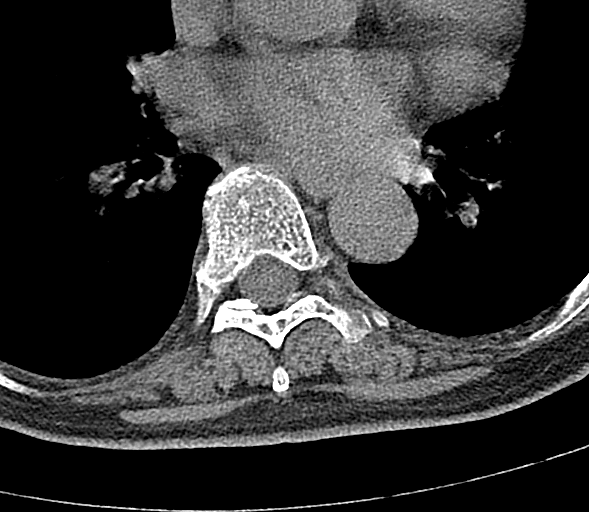
[im 80/159  bone]
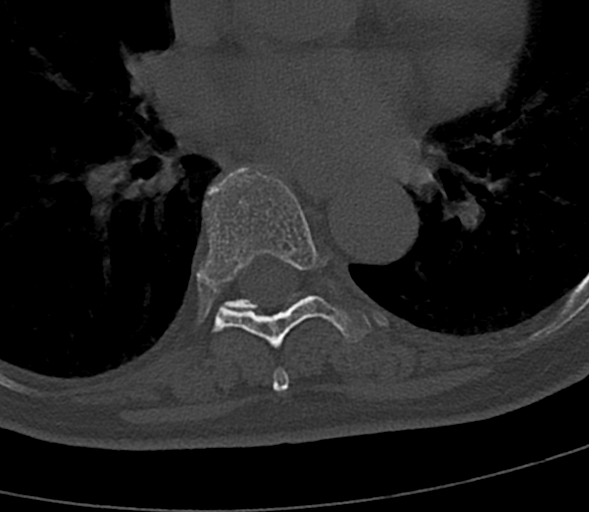

[Series 8: t-spine 2.00 br60 s3 · sagittal · 0.30mm/px · 5 of 80 slices shown (2 of 3)]
[im 27/80  bone]
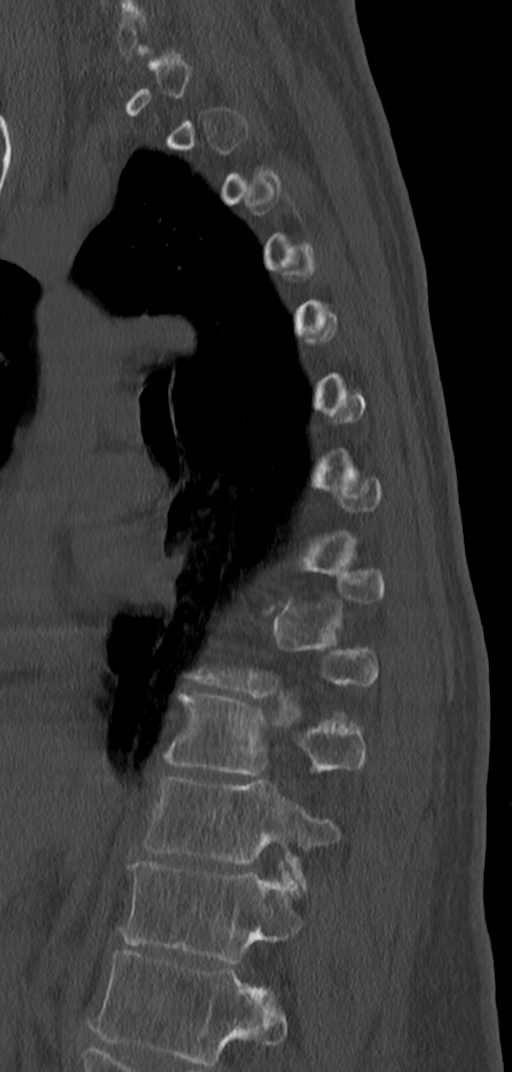
[im 33/80  bone]
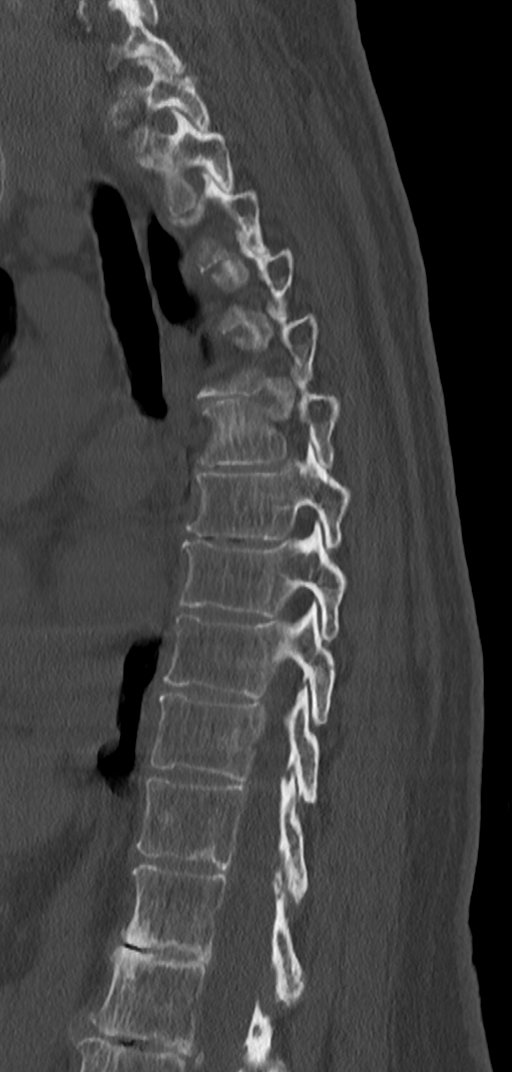
[im 40/80  bone]
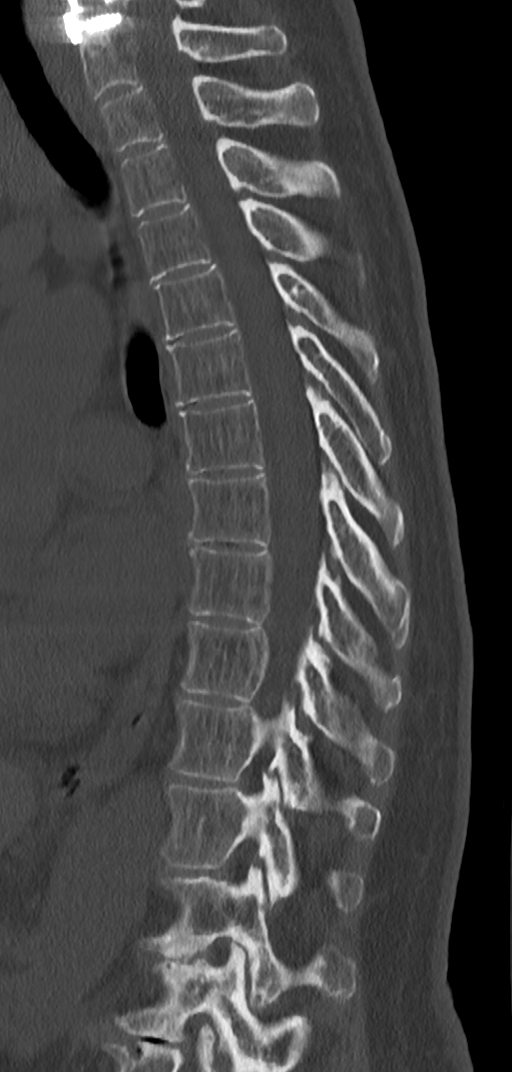
[im 47/80  bone]
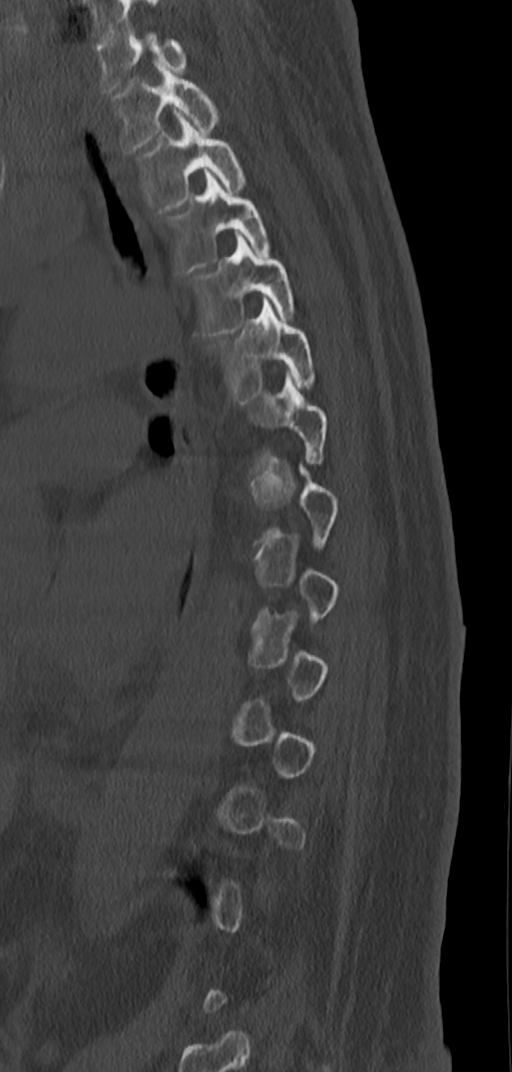
[im 53/80  bone]
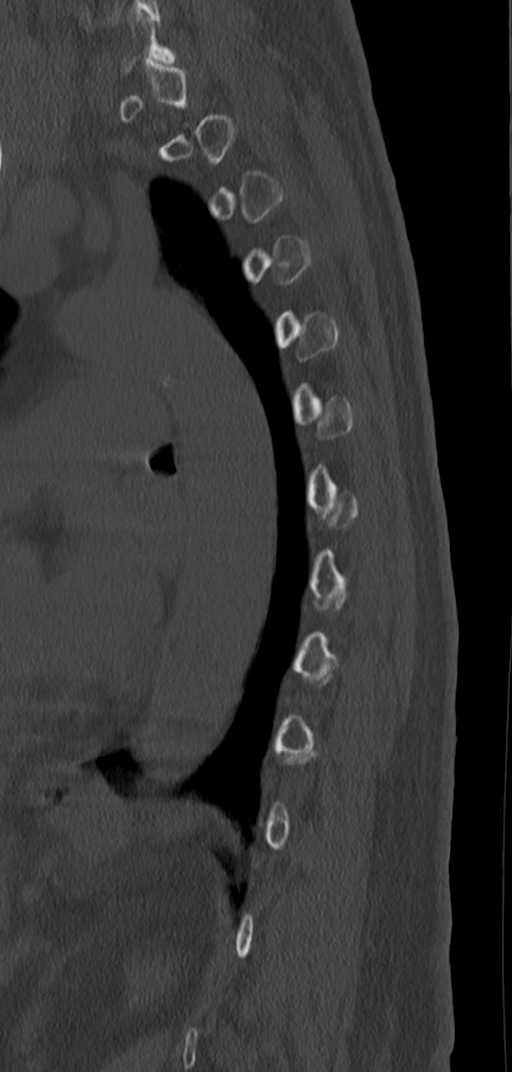

[Series 10: t-spine 2.00 br60 s3 · coronal · 0.32mm/px · 3 of 76 slices shown (3 of 3)]
[im 16/76  bone]
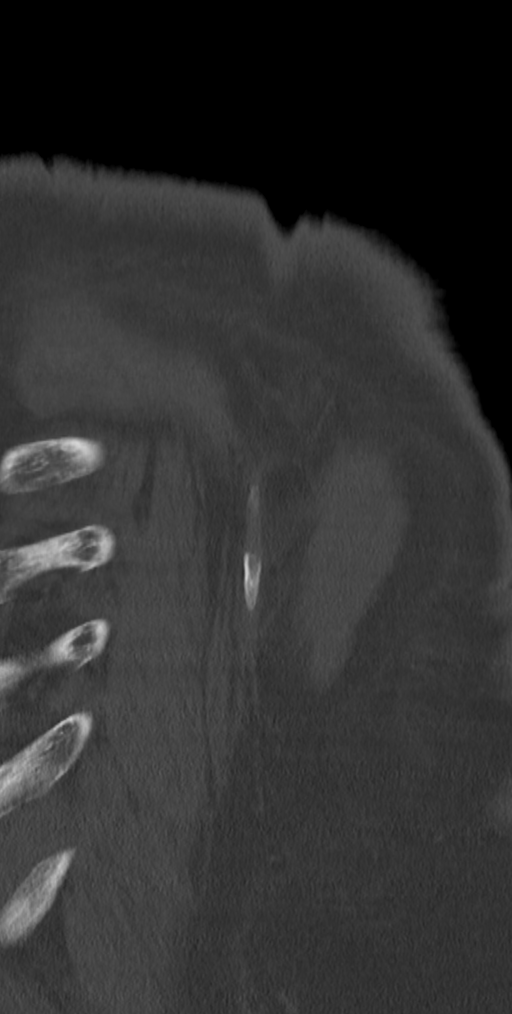
[im 31/76  bone]
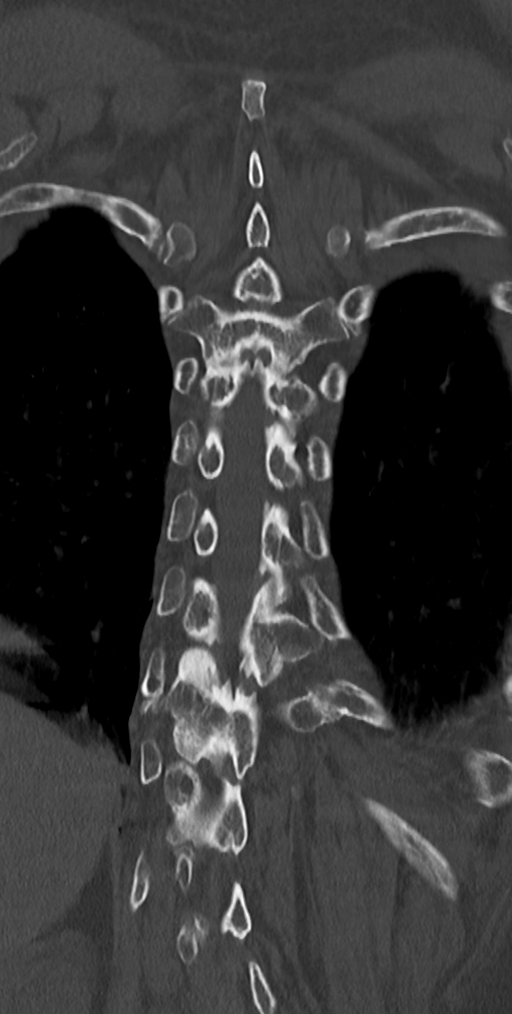
[im 46/76  bone]
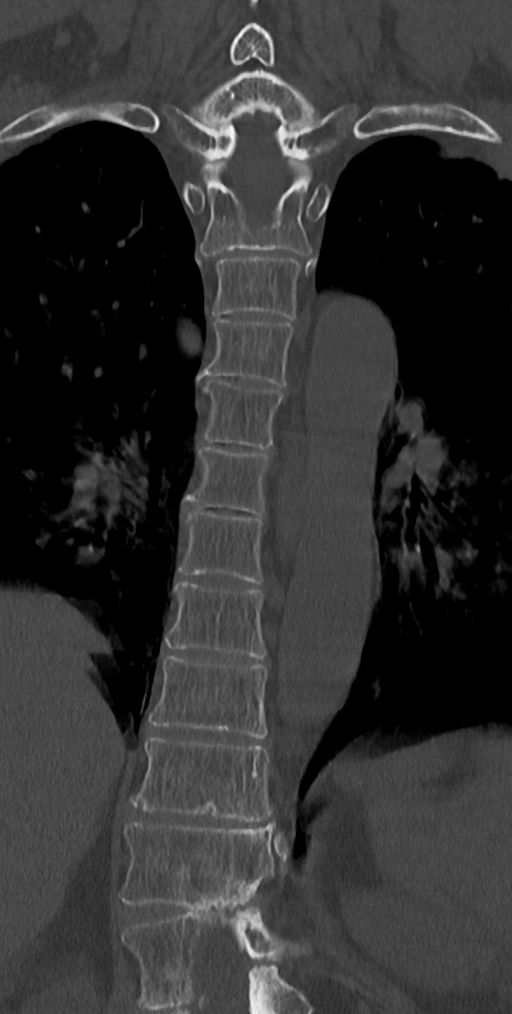

[9 of 33 positions shown; findings below may reference images not displayed]

FINDINGS: CT THORACIC SPINE FINDINGS

Alignment: Mild lower thoracic compensatory dextroscoliosis.

Vertebrae: Generalized osteopenic appearance. No evidence of
fracture, discitis, or aggressive bone lesion.

Paraspinal and other soft tissues: No evidence of perispinal mass or
inflammation. Partially covered goiter with superior thyroidal
extension.

Disc levels: ACDF and left-sided C6-7 laminotomy.

Facet osteoarthritis on the right at C7-T1.

T12-L1 degenerative disc collapse and endplate irregularity leftward
mild spurring. Mild left foraminal narrowing.

CT LUMBAR SPINE FINDINGS

Segmentation: Lowest open disc space is numbered L5-S1.

Alignment: Prominent dextroscoliosis with right lateral translation
the level of L3-4.

Vertebrae: No evidence of fracture, discitis, or aggressive bone
lesion.

Paraspinal and other soft tissues: Negative for perispinal mass or
inflammation

Disc levels:

T12- L1: Leftward disc collapse and endplate degeneration with facet
spurring. Mild left foraminal narrowing

L1-L2: Leftward disc collapse and endplate degeneration with facet
spurring. Mild to moderate left foraminal and subarticular recess
narrowing

L2-L3: Disc collapse and endplate degeneration with facet spurring
asymmetric to the left. Moderate narrowing the spinal canal

L3-L4: Disc collapse and endplate degeneration with facet spurring
asymmetric to the right. Moderate spinal canal narrowing and
bilateral subarticular recess narrowing. Mild-to-moderate right
foraminal narrowing

L4-L5: Disc collapse eccentric to the right with endplate
degeneration and ridging. Mild right foraminal narrowing

L5-S1:Disc collapse and endplate ridging effacing the inferior
foramina. Mild facet spurring.
IMPRESSION: CT THORACIC SPINE IMPRESSION

1. Mild compensatory lower thoracic scoliosis.
2. Notable disc degeneration at T12-L1 and facet osteoarthritis on
the right at C7-T1.

CT LUMBAR SPINE IMPRESSION

Dextroscoliosis with multilevel asymmetric disc collapse and facet
spurring causing mild-to-moderate foraminal and subarticular recess
narrowing. Moderate spinal stenosis at L2-3 and L3-4.
